# Patient Record
Sex: Female | Born: 2000 | Race: Black or African American | Hispanic: No | Smoking: Never smoker
Health system: Southern US, Community
[De-identification: ages and names within clinical notes are randomized; demographics above are authoritative.]

## PROBLEM LIST (undated history)

## (undated) DIAGNOSIS — Z789 Other specified health status: Secondary | ICD-10-CM

## (undated) DIAGNOSIS — D689 Coagulation defect, unspecified: Secondary | ICD-10-CM

## (undated) DIAGNOSIS — F419 Anxiety disorder, unspecified: Secondary | ICD-10-CM

## (undated) DIAGNOSIS — F32A Depression, unspecified: Secondary | ICD-10-CM

## (undated) HISTORY — PX: NO PAST SURGERIES: SHX2092

## (undated) HISTORY — DX: Anxiety disorder, unspecified: F41.9

## (undated) HISTORY — PX: CHOLECYSTECTOMY: SHX55

## (undated) HISTORY — DX: Depression, unspecified: F32.A

## (undated) HISTORY — DX: Coagulation defect, unspecified: D68.9

---
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
## 2019-10-23 DIAGNOSIS — Z3403 Encounter for supervision of normal first pregnancy, third trimester: Secondary | ICD-10-CM | POA: Insufficient documentation

## 2019-10-24 LAB — OB RESULTS CONSOLE GC/CHLAMYDIA
Chlamydia: NEGATIVE
Gonorrhea: NEGATIVE

## 2019-10-24 LAB — OB RESULTS CONSOLE RPR: RPR: NONREACTIVE

## 2019-10-24 LAB — OB RESULTS CONSOLE VARICELLA ZOSTER ANTIBODY, IGG: Varicella: NON-IMMUNE/NOT IMMUNE

## 2019-10-24 LAB — OB RESULTS CONSOLE HEPATITIS B SURFACE ANTIGEN: Hepatitis B Surface Ag: NEGATIVE

## 2019-10-24 LAB — OB RESULTS CONSOLE RUBELLA ANTIBODY, IGM: Rubella: IMMUNE

## 2020-03-06 ENCOUNTER — Other Ambulatory Visit: Payer: Self-pay

## 2020-03-06 ENCOUNTER — Observation Stay: Payer: PRIVATE HEALTH INSURANCE

## 2020-03-06 ENCOUNTER — Observation Stay
Admission: EM | Admit: 2020-03-06 | Discharge: 2020-03-07 | Disposition: A | Discharge status: Home / Self Care | Payer: PRIVATE HEALTH INSURANCE | Attending: Obstetrics and Gynecology | Admitting: Obstetrics and Gynecology

## 2020-03-06 DIAGNOSIS — O99213 Obesity complicating pregnancy, third trimester: Secondary | ICD-10-CM | POA: Insufficient documentation

## 2020-03-06 DIAGNOSIS — R109 Unspecified abdominal pain: Secondary | ICD-10-CM | POA: Diagnosis present

## 2020-03-06 DIAGNOSIS — O26899 Other specified pregnancy related conditions, unspecified trimester: Secondary | ICD-10-CM

## 2020-03-06 DIAGNOSIS — Z3A31 31 weeks gestation of pregnancy: Secondary | ICD-10-CM | POA: Insufficient documentation

## 2020-03-06 DIAGNOSIS — O4703 False labor before 37 completed weeks of gestation, third trimester: Principal | ICD-10-CM | POA: Insufficient documentation

## 2020-03-06 DIAGNOSIS — O26893 Other specified pregnancy related conditions, third trimester: Secondary | ICD-10-CM | POA: Diagnosis present

## 2020-03-06 LAB — ABO/RH: ABO/RH(D): O POS

## 2020-03-06 LAB — PROTIME-INR
INR: 1 (ref 0.8–1.2)
Prothrombin Time: 12.8 seconds (ref 11.4–15.2)

## 2020-03-06 LAB — URINALYSIS, COMPLETE (UACMP) WITH MICROSCOPIC
Bilirubin Urine: NEGATIVE
Glucose, UA: NEGATIVE mg/dL
Hgb urine dipstick: NEGATIVE
Ketones, ur: 80 mg/dL — AB
Leukocytes,Ua: NEGATIVE
Nitrite: NEGATIVE
Protein, ur: 30 mg/dL — AB
Specific Gravity, Urine: 1.03 (ref 1.005–1.030)
pH: 5 (ref 5.0–8.0)

## 2020-03-06 LAB — FIBRIN DERIVATIVES D-DIMER (ARMC ONLY): Fibrin derivatives D-dimer (ARMC): 1121.72 ng/mL (FEU) — ABNORMAL HIGH (ref 0.00–499.00)

## 2020-03-06 LAB — CBC
HCT: 36.9 % (ref 36.0–46.0)
Hemoglobin: 12.2 g/dL (ref 12.0–15.0)
MCH: 26.2 pg (ref 26.0–34.0)
MCHC: 33.1 g/dL (ref 30.0–36.0)
MCV: 79.2 fL — ABNORMAL LOW (ref 80.0–100.0)
Platelets: 277 10*3/uL (ref 150–400)
RBC: 4.66 MIL/uL (ref 3.87–5.11)
RDW: 13.9 % (ref 11.5–15.5)
WBC: 11.3 10*3/uL — ABNORMAL HIGH (ref 4.0–10.5)
nRBC: 0 % (ref 0.0–0.2)

## 2020-03-06 LAB — CHLAMYDIA/NGC RT PCR (ARMC ONLY)
Chlamydia Tr: NOT DETECTED
N gonorrhoeae: NOT DETECTED

## 2020-03-06 LAB — KLEIHAUER-BETKE STAIN
Fetal Cells %: 0 %
Quantitation Fetal Hemoglobin: 0 mL

## 2020-03-06 LAB — TYPE AND SCREEN
ABO/RH(D): O POS
Antibody Screen: NEGATIVE

## 2020-03-06 LAB — APTT: aPTT: 27 seconds (ref 24–36)

## 2020-03-06 LAB — WET PREP, GENITAL
Clue Cells Wet Prep HPF POC: NONE SEEN
Sperm: NONE SEEN
Trich, Wet Prep: NONE SEEN
Yeast Wet Prep HPF POC: NONE SEEN

## 2020-03-06 LAB — FIBRINOGEN: Fibrinogen: 721 mg/dL — ABNORMAL HIGH (ref 210–475)

## 2020-03-06 LAB — TECHNOLOGIST SMEAR REVIEW

## 2020-03-06 LAB — FETAL FIBRONECTIN: Fetal Fibronectin: NEGATIVE

## 2020-03-06 MED ORDER — CALCIUM CARBONATE ANTACID 500 MG PO CHEW
2.0000 | CHEWABLE_TABLET | ORAL | Status: DC | PRN
Start: 2020-03-06 — End: 2020-03-07

## 2020-03-06 MED ORDER — LACTATED RINGERS IV SOLN: INTRAVENOUS | Status: DC

## 2020-03-06 MED ORDER — ZOLPIDEM TARTRATE 5 MG PO TABS
5.0000 mg | ORAL_TABLET | Freq: Every evening | ORAL | Status: DC | PRN
  Administered 2020-03-06: 5 mg via ORAL
  Filled 2020-03-06: qty 1

## 2020-03-06 MED ORDER — ACETAMINOPHEN 325 MG PO TABS: 650.0000 mg | ORAL_TABLET | ORAL | Status: DC | PRN

## 2020-03-06 MED ORDER — LACTATED RINGERS IV BOLUS
500.0000 mL | Freq: Once | INTRAVENOUS | Status: AC
  Administered 2020-03-06: 500 mL via INTRAVENOUS

## 2020-03-06 MED ORDER — DOCUSATE SODIUM 100 MG PO CAPS: 100.0000 mg | ORAL_CAPSULE | Freq: Every day | ORAL | Status: DC

## 2020-03-06 MED ORDER — PRENATAL MULTIVITAMIN CH: 1.0000 | ORAL_TABLET | Freq: Every day | ORAL | Status: DC

## 2020-03-06 NOTE — H&P (Signed)
OB History & Physical   History of Present Illness:  Chief Complaint: fell on her abdomen around 1830  HPI:  Donna Buckley is a 19 y.o. G1P0 female at [redacted]w[redacted]d dated by LMP and c/w [place] at [redacted]wks GA.  She presents to L&D for abdominal pain after a trip and fall this evening. Reports lower abdominal discomfort and pressure, noted pink spotting when she wiped after initial void after the incident. Denies LOF or bright red bleeding. Feeling active FM since arrival in ER.    Pregnancy Issues: 1. Varicella non-immune 2. Obesity, BMI 39 pre-preg 3. Elevated 1hr GTT 136, 3hr GTT wnl.    Maternal Medical History:  History reviewed. No pertinent past medical history.  Past Surgical History:  Procedure Laterality Date  . NO PAST SURGERIES      No Known Allergies  Prior to Admission medications   Medication Sig Start Date End Date Taking? Authorizing Provider  Prenatal Vit-Fe Fumarate-FA (PRENATAL MULTIVITAMIN) TABS tablet Take 1 tablet by mouth daily at 12 noon.   Yes [provider]     Prenatal care site: [facility]  Social History: She  reports that she has never smoked. She has never used smokeless tobacco. She reports previous alcohol use. She reports that she does not use drugs.  Family History: no family hx Gyn cancers, MGM has diabetes.   Review of Systems: A full review of systems was performed and negative except as noted in the HPI.      Physical Exam:  Vital Signs: BP (!) 124/64 (BP Location: Right Arm)   Pulse 97   Temp 99 F (37.2 C) (Oral)   Resp 18   Ht 5\' 2"  (1.575 m)   Wt (!) 101.2 kg   LMP 07/31/2019 (Exact Date)   BMI 40.79 kg/m   General: no acute distress.  HEENT: normocephalic, atraumatic Heart: regular rate & rhythm.  No murmurs/rubs/gallops Lungs: clear to auscultation bilaterally, normal respiratory effort Abdomen: soft, gravid, non-tender at fundus, slight tenderness across lower abdomen, pt reports area with impact at midline  lower abdomen.  Pelvic: SSE: cloudy white DC noted, cervix with scant normal appearing mucus and visually closed. No bleeding, no LOF or pooling.   External: Normal external female genitalia  Cervix: Dilation: Closed / Effacement (%): Thick /      Extremities: non-tender, symmetric, no edema bilaterally.  DTRs: 2+  Neurologic: Alert & oriented x 3.    No results found for this or any previous visit (from the past 24 hour(s)).  Pertinent Results:  Prenatal Labs: Blood type/Rh O pos  Antibody screen neg  Rubella Immune  Varicella  NON-Immune  RPR NR  HBsAg Neg  HIV NR  GC neg  Chlamydia neg  Genetic screening Negative MaterniT21 and AFP, female fetus  1 hour GTT  136  3 hour GTT 86, 121, 101, 92  GBS  n/a   FHT: 135bpm, mod variability, + accels, no decels TOCO: q13min, palp mild, fundus soft and NT.  SVE:  Dilation: Closed / Effacement (%): Thick /      Cephalic by leopolds  No results found.  Assessment:  Donna Buckley is a 19 y.o. G1P0 female at [redacted]w[redacted]d with abdominal pain and contractions after blunt trauma, fall on abdomen.   Plan:  1. Admit to Labor & Delivery; consents reviewed and obtained - D/w Dr [redacted]w[redacted]d regarding POC.  - Labs ordered including type and screen, CBC, KB screen and DIC panel.  - IV bolus and continuous  fluids ordered for hydration.  - continuous EFM and toco x 24hrs.  - wet prep, GC/CT and FFN obtained, last IC was approx 18hrs ago per pt.  - OB Limited US ordered for presentation, placental location and AFI. BPP requested.   2. Fetal Well being  - Fetal Tracing: Cat I - Group B Streptococcus ppx indicated: n/a  3. Routine OB: - Prenatal labs reviewed, as above - Rh O Pos - Clear fluid diet, IVF- LR    Randa Ngo, CNM 03/06/20 8:22 PM

## 2020-03-06 NOTE — OB Triage Note (Signed)
Pt [redacted]w[redacted]d G1P0 presents [facility] through the ED with c/o abdominal pressure after fall at around 630 pm this evening. States she felt baby move in ED, denied LOF. States she had some pink tinged discharge when wiping before arriving, after arriving states it had cleared up. VSS. Monitors applied and assessing. [id number] at 2.

## 2020-03-07 DIAGNOSIS — O4703 False labor before 37 completed weeks of gestation, third trimester: Secondary | ICD-10-CM | POA: Diagnosis not present

## 2020-03-07 MED ORDER — ACETAMINOPHEN 325 MG PO TABS: 650.0000 mg | ORAL_TABLET | ORAL | Status: DC | PRN

## 2020-03-07 MED ORDER — CALCIUM CARBONATE ANTACID 500 MG PO CHEW: 2.0000 | CHEWABLE_TABLET | ORAL | Status: DC | PRN

## 2020-03-07 MED ORDER — SODIUM CHLORIDE 0.9 % IV SOLN: 250.0000 mL | INTRAVENOUS | Status: DC | PRN

## 2020-03-07 MED ORDER — SODIUM CHLORIDE 0.9% FLUSH: 3.0000 mL | Freq: Two times a day (BID) | INTRAVENOUS | Status: DC

## 2020-03-07 MED ORDER — SODIUM CHLORIDE 0.9% FLUSH
3.0000 mL | INTRAVENOUS | Status: DC | PRN
  Administered 2020-03-07: 3 mL via INTRAVENOUS

## 2020-03-07 NOTE — Discharge Summary (Signed)
Patient ID: Donna Buckley MRN: [id number] DOB/AGE: 21-Apr-2001 19 y.o.  Admit date: 03/06/2020 Discharge date: 03/07/2020  Admission Diagnoses: s/p fall, abdominal pain in pregnancy, 31wks with preterm contractions  Discharge Diagnoses: s/p fall, abdominal pain in pregnancy, 31wks with preterm contractions  Prenatal Procedures: NST and ultrasound  Consults: Neonatology, Maternal Fetal Medicine  Significant Diagnostic Studies:  Results for orders placed or performed during the hospital encounter of 03/06/20 (from the past 168 hour(s))  Chlamydia/NGC rt PCR ([facility] only)   Collection Time: 03/06/20  7:54 PM  Result Value Ref Range   Specimen source GC/Chlam ENDOCERVICAL    Chlamydia Tr NOT DETECTED NOT DETECTED   N gonorrhoeae NOT DETECTED NOT DETECTED  Wet prep, genital   Collection Time: 03/06/20  7:54 PM  Result Value Ref Range   Yeast Wet Prep HPF POC NONE SEEN NONE SEEN   Trich, Wet Prep NONE SEEN NONE SEEN   Clue Cells Wet Prep HPF POC NONE SEEN NONE SEEN   WBC, Wet Prep HPF POC FEW (A) NONE SEEN   Sperm NONE SEEN   Urinalysis, Complete w Microscopic   Collection Time: 03/06/20  7:54 PM  Result Value Ref Range   Color, Urine AMBER (A) YELLOW   APPearance HAZY (A) CLEAR   Specific Gravity, Urine 1.030 1.005 - 1.030   pH 5.0 5.0 - 8.0   Glucose, UA NEGATIVE NEGATIVE mg/dL   Hgb urine dipstick NEGATIVE NEGATIVE   Bilirubin Urine NEGATIVE NEGATIVE   Ketones, ur 80 (A) NEGATIVE mg/dL   Protein, ur 30 (A) NEGATIVE mg/dL   Nitrite NEGATIVE NEGATIVE   Leukocytes,Ua NEGATIVE NEGATIVE   RBC / HPF 0-5 0 - 5 RBC/hpf   WBC, UA 0-5 0 - 5 WBC/hpf   Bacteria, UA RARE (A) NONE SEEN   Squamous Epithelial / LPF 6-10 0 - 5   Mucus PRESENT   CBC on admission   Collection Time: 03/06/20  7:58 PM  Result Value Ref Range   WBC 11.3 (H) 4.0 - 10.5 K/uL   RBC 4.66 3.87 - 5.11 MIL/uL   Hemoglobin 12.2 12.0 - 15.0 g/dL   HCT 50.0 36 - 46 %   MCV 79.2 (L) 80.0 - 100.0 fL   MCH 26.2 26.0 -  34.0 pg   MCHC 33.1 30.0 - 36.0 g/dL   RDW 93.8 18.2 - 99.3 %   Platelets 277 150 - 400 K/uL   nRBC 0.0 0.0 - 0.2 %  Kleihauer-Betke stain   Collection Time: 03/06/20  7:58 PM  Result Value Ref Range   Fetal Cells % 0 %   Quantitation Fetal Hemoglobin 0.0000 mL   # Vials RhIg NOT INDICATED   Fibrin derivatives D-Dimer ([facility] only)   Collection Time: 03/06/20  7:58 PM  Result Value Ref Range   Fibrin derivatives D-dimer ([facility]) 1,121.72 (H) 0.00 - 499.00 ng/mL (FEU)  Fibrinogen   Collection Time: 03/06/20  7:58 PM  Result Value Ref Range   Fibrinogen 721 (H) 210 - 475 mg/dL  Protime-INR   Collection Time: 03/06/20  7:58 PM  Result Value Ref Range   Prothrombin Time 12.8 11.4 - 15.2 seconds   INR 1.0 0.8 - 1.2  APTT   Collection Time: 03/06/20  7:58 PM  Result Value Ref Range   aPTT 27 24 - 36 seconds  Technologist smear review   Collection Time: 03/06/20  7:58 PM  Result Value Ref Range   WBC Morphology MORPHOLOGY UNREMARKABLE    RBC Morphology MORPHOLOGY UNREMARKABLE  Tech Review MORPHOLOGY UNREMARKABLE   Type and screen [facility]   Collection Time: 03/06/20  7:58 PM  Result Value Ref Range   ABO/RH(D) O POS    Antibody Screen NEG    Sample Expiration      03/09/2020,2359 Performed at [facility], [address] [place] [place], [place] [address]   Fetal fibronectin   Collection Time: 03/06/20  8:11 PM  Result Value Ref Range   Fetal Fibronectin NEGATIVE NEGATIVE  ABO/Rh   Collection Time: 03/06/20  9:20 PM  Result Value Ref Range   ABO/RH(D)      O POS Performed at [facility], [address] [place], [place] [address]     Treatments: IV hydration and US performed, continuous EFM x 24hrs s/p fall.   Hospital Course:  This is a 19 y.o. G1P0 with IUP at [redacted]w[redacted]d admitted for s/p blunt abdominal trauma to lower abdomen. Noted to have regular q33min UCs on arrival. Had noted slight amount of pink discharge when she wiped x 1,  but no further after arrival. Noted active FM.  No leaking of fluid and no bright red bleeding.  She was given IV bolus and hydration; DIC panel, type and screen and KB screen all WNL. FFN, wet prep and GC/CT all negative.  She was observed, fetal heart rate monitoring remained reassuring, and she had no signs/symptoms of  preterm labor or abruption. She was deemed stable for discharge to home with outpatient follow up.  Discharge Physical Exam:  BP (!) 109/48   Pulse 83   Temp 97.8 F (36.6 C) (Oral)   Resp 16   Ht 5\' 2"  (1.575 m)   Wt (!) 101.2 kg   LMP 07/31/2019 (Exact Date)   BMI 40.79 kg/m   General: NAD CV: RRR Pulm: CTABL, nl effort ABD: s/nd/nt, gravid DVT Evaluation: LE non-ttp, no evidence of DVT on exam.  SVE: Dilation: Closed Effacement (%): Thick Exam by:: Prisilla Kocsis CNM  FHT: 135bpm, mod variability, + accels, no decels TOCO: UI occasionally    Discharge Condition: Stable  Disposition: Discharge disposition: 01-Home or Self Care        Allergies as of 03/07/2020   No Known Allergies     Medication List    TAKE these medications   acetaminophen 325 MG tablet Commonly known as: TYLENOL Take 2 tablets (650 mg total) by mouth every 4 (four) hours as needed (for pain scale < 4  OR  temperature  >/=  100.5 F).   calcium carbonate 500 MG chewable tablet Commonly known as: TUMS - dosed in mg elemental calcium Chew 2 tablets (400 mg of elemental calcium total) by mouth every 4 (four) hours as needed for indigestion.   prenatal multivitamin Tabs tablet Take 1 tablet by mouth daily at 12 noon.       Follow-up Information    [facility] Follow up.   Why: Keep scheduled appointment this coming week. Contact information: [address] [place] [address]              Signed:  [phone], CNM 03/07/2020  6:35 PM

## 2020-04-08 LAB — OB RESULTS CONSOLE HIV ANTIBODY (ROUTINE TESTING): HIV: NONREACTIVE

## 2020-04-08 LAB — OB RESULTS CONSOLE GBS: GBS: POSITIVE

## 2020-04-12 ENCOUNTER — Observation Stay
Admission: EM | Admit: 2020-04-12 | Discharge: 2020-04-12 | Disposition: A | Discharge status: Home / Self Care | Payer: PRIVATE HEALTH INSURANCE | Attending: Certified Nurse Midwife | Admitting: Certified Nurse Midwife

## 2020-04-12 ENCOUNTER — Observation Stay: Payer: PRIVATE HEALTH INSURANCE

## 2020-04-12 ENCOUNTER — Encounter: Payer: Self-pay | Admitting: Obstetrics and Gynecology

## 2020-04-12 ENCOUNTER — Other Ambulatory Visit: Payer: Self-pay

## 2020-04-12 DIAGNOSIS — O99891 Other specified diseases and conditions complicating pregnancy: Secondary | ICD-10-CM | POA: Diagnosis not present

## 2020-04-12 DIAGNOSIS — Z3A36 36 weeks gestation of pregnancy: Secondary | ICD-10-CM | POA: Insufficient documentation

## 2020-04-12 DIAGNOSIS — M549 Dorsalgia, unspecified: Secondary | ICD-10-CM | POA: Diagnosis not present

## 2020-04-12 HISTORY — DX: Other specified health status: Z78.9

## 2020-04-12 LAB — URINALYSIS, COMPLETE (UACMP) WITH MICROSCOPIC
Bacteria, UA: NONE SEEN
Bilirubin Urine: NEGATIVE
Glucose, UA: NEGATIVE mg/dL
Hgb urine dipstick: NEGATIVE
Ketones, ur: NEGATIVE mg/dL
Nitrite: NEGATIVE
Protein, ur: NEGATIVE mg/dL
Specific Gravity, Urine: 1.012 (ref 1.005–1.030)
pH: 8 (ref 5.0–8.0)

## 2020-04-12 LAB — COMPREHENSIVE METABOLIC PANEL
ALT: 27 U/L (ref 0–44)
AST: 49 U/L — ABNORMAL HIGH (ref 15–41)
Albumin: 2.6 g/dL — ABNORMAL LOW (ref 3.5–5.0)
Alkaline Phosphatase: 132 U/L — ABNORMAL HIGH (ref 38–126)
Anion gap: 12 (ref 5–15)
BUN: <5 mg/dL — ABNORMAL LOW (ref 6–20)
CO2: 20 mmol/L — ABNORMAL LOW (ref 22–32)
Calcium: 8.7 mg/dL — ABNORMAL LOW (ref 8.9–10.3)
Chloride: 106 mmol/L (ref 98–111)
Creatinine, Ser: 0.5 mg/dL (ref 0.44–1.00)
GFR calc Af Amer: >60 mL/min (ref >60)
GFR calc non Af Amer: >60 mL/min (ref >60)
Glucose, Bld: 75 mg/dL (ref 70–99)
Potassium: 3.8 mmol/L (ref 3.5–5.1)
Sodium: 138 mmol/L (ref 135–145)
Total Bilirubin: 0.9 mg/dL (ref 0.3–1.2)
Total Protein: 6.8 g/dL (ref 6.5–8.1)

## 2020-04-12 LAB — CBC
HCT: 31.9 % — ABNORMAL LOW (ref 36.0–46.0)
Hemoglobin: 10.6 g/dL — ABNORMAL LOW (ref 12.0–15.0)
MCH: 26.2 pg (ref 26.0–34.0)
MCHC: 33.2 g/dL (ref 30.0–36.0)
MCV: 78.8 fL — ABNORMAL LOW (ref 80.0–100.0)
Platelets: 253 10*3/uL (ref 150–400)
RBC: 4.05 MIL/uL (ref 3.87–5.11)
RDW: 14.5 % (ref 11.5–15.5)
WBC: 10 10*3/uL (ref 4.0–10.5)
nRBC: 0 % (ref 0.0–0.2)

## 2020-04-12 LAB — PROTEIN / CREATININE RATIO, URINE
Creatinine, Urine: 94 mg/dL
Protein Creatinine Ratio: 0.16 mg/mg{Cre} — ABNORMAL HIGH (ref 0.00–0.15)
Total Protein, Urine: 15 mg/dL

## 2020-04-12 MED ORDER — ACETAMINOPHEN 325 MG PO TABS
650.0000 mg | ORAL_TABLET | ORAL | Status: DC | PRN
  Administered 2020-04-12: 650 mg via ORAL
  Filled 2020-04-12: qty 2

## 2020-04-12 MED ORDER — CYCLOBENZAPRINE HCL 10 MG PO TABS: 10.0000 mg | ORAL_TABLET | Freq: Three times a day (TID) | ORAL | 0 refills | Status: DC | PRN

## 2020-04-12 MED ORDER — CYCLOBENZAPRINE HCL 10 MG PO TABS
10.0000 mg | ORAL_TABLET | Freq: Three times a day (TID) | ORAL | Status: DC | PRN
  Administered 2020-04-12: 10 mg via ORAL
  Filled 2020-04-12 (×2): qty 1

## 2020-04-12 NOTE — Discharge Summary (Signed)
Donna Buckley is a 19 y.o. female. She is at [redacted]w[redacted]d gestation. Patient's last menstrual period was 07/31/2019 (exact date). Estimated Date of Delivery: 05/06/20  Prenatal care site: [facility]   Chief complaint: back pain Location: back pain, left side more than right Onset/timing: today around 0630 Duration: constant then intermittent Severity: mild to severe Aggravating or alleviating conditions: none Associated signs/symptoms: abdominal pain/tightness Context: Donna Buckley reports back pain that started this morning. It was initially constant, but now seems to be coming and going. Nothing in particular seems to make it any better or worse. She has not taken any medication to see if it helps. She reports daily BMs without constipation or diarrhea. She reports no urinary symptoms.   S: Resting comfortably.   She reports:  -active fetal movement -no leakage of fluid -no vaginal bleeding -occasional contractions  Maternal Medical History:   Past Medical History:  Diagnosis Date  . Medical history non-contributory     Past Surgical History:  Procedure Laterality Date  . NO PAST SURGERIES      No Known Allergies  Prior to Admission medications   Medication Sig Start Date End Date Taking? Authorizing Provider  acetaminophen (TYLENOL) 325 MG tablet Take 2 tablets (650 mg total) by mouth every 4 (four) hours as needed (for pain scale < 4  OR  temperature  >/=  100.5 F). 03/07/20  Yes McVey, Prudencio Pair, CNM  Prenatal Vit-Fe Fumarate-FA (PRENATAL MULTIVITAMIN) TABS tablet Take 1 tablet by mouth daily at 12 noon.   Yes [provider]  calcium carbonate (TUMS - DOSED IN MG ELEMENTAL CALCIUM) 500 MG chewable tablet Chew 2 tablets (400 mg of elemental calcium total) by mouth every 4 (four) hours as needed for indigestion. Patient not taking: Reported on 04/12/2020 03/07/20   McVey, Prudencio Pair, CNM     Social History: She  reports that she has never smoked. She has never  used smokeless tobacco. She reports previous alcohol use. She reports that she does not use drugs.  Family History: family history is not on file.  Review of Systems: A full review of systems was performed and negative except as noted in the HPI.     O:  BP (!) 141/80 (BP Location: Left Arm)   Pulse (!) 115   Temp 97.7 F (36.5 C) (Oral)   Resp 20   Ht 5\' 2"  (1.575 m)   Wt 104.3 kg   LMP 07/31/2019 (Exact Date)   BMI 42.07 kg/m  Results for orders placed or performed during the hospital encounter of 04/12/20 (from the past 48 hour(s))  Urinalysis, Complete w Microscopic Urine, Clean Catch   Collection Time: 04/12/20  8:51 AM  Result Value Ref Range   Color, Urine YELLOW (A) YELLOW   APPearance CLOUDY (A) CLEAR   Specific Gravity, Urine 1.012 1.005 - 1.030   pH 8.0 5.0 - 8.0   Glucose, UA NEGATIVE NEGATIVE mg/dL   Hgb urine dipstick NEGATIVE NEGATIVE   Bilirubin Urine NEGATIVE NEGATIVE   Ketones, ur NEGATIVE NEGATIVE mg/dL   Protein, ur NEGATIVE NEGATIVE mg/dL   Nitrite NEGATIVE NEGATIVE   Leukocytes,Ua SMALL (A) NEGATIVE   WBC, UA 0-5 0 - 5 WBC/hpf   Bacteria, UA NONE SEEN NONE SEEN   Squamous Epithelial / LPF 21-50 0 - 5   Mucus PRESENT    Amorphous Crystal PRESENT   Protein / creatinine ratio, urine   Collection Time: 04/12/20  8:51 AM  Result Value Ref Range  Creatinine, Urine 94 mg/dL   Total Protein, Urine 15 mg/dL   Protein Creatinine Ratio 0.16 (H) 0.00 - 0.15 mg/mg[Cre]  Comprehensive metabolic panel   Collection Time: 04/12/20 10:24 AM  Result Value Ref Range   Sodium 138 135 - 145 mmol/L   Potassium 3.8 3.5 - 5.1 mmol/L   Chloride 106 98 - 111 mmol/L   CO2 20 (L) 22 - 32 mmol/L   Glucose, Bld 75 70 - 99 mg/dL   BUN <5 (L) 6 - 20 mg/dL   Creatinine, Ser 2.42 0.44 - 1.00 mg/dL   Calcium 8.7 (L) 8.9 - 10.3 mg/dL   Total Protein 6.8 6.5 - 8.1 g/dL   Albumin 2.6 (L) 3.5 - 5.0 g/dL   AST 49 (H) 15 - 41 U/L   ALT 27 0 - 44 U/L   Alkaline Phosphatase 132  (H) 38 - 126 U/L   Total Bilirubin 0.9 0.3 - 1.2 mg/dL   GFR calc non Af Amer >60 >60 mL/min   GFR calc Af Amer >60 >60 mL/min   Anion gap 12 5 - 15  CBC on admission   Collection Time: 04/12/20 10:24 AM  Result Value Ref Range   WBC 10.0 4.0 - 10.5 K/uL   RBC 4.05 3.87 - 5.11 MIL/uL   Hemoglobin 10.6 (L) 12.0 - 15.0 g/dL   HCT 68.3 (L) 36 - 46 %   MCV 78.8 (L) 80.0 - 100.0 fL   MCH 26.2 26.0 - 34.0 pg   MCHC 33.2 30.0 - 36.0 g/dL   RDW 41.9 62.2 - 29.7 %   Platelets 253 150 - 400 K/uL   nRBC 0.0 0.0 - 0.2 %     Constitutional: NAD, AAOx3  HE/ENT: extraocular movements grossly intact, moist mucous membranes CV: RRR PULM: normal respiratory effort, CTABL     Abd: gravid, mild generalized tenderness, non-distended, soft      Ext: Non-tender, Nonedmeatous   Psych: mood appropriate, speech normal Pelvic: cervix extremely posterior per RN exam  NST/monitoring:  Baseline: 130bpm Variability: moderate Accelerations: 15x15 present x >2 Decelerations: absent Time: 3 hours Toco: initially q3-4 minutes, then occasional   A/P: 19 y.o. [redacted]w[redacted]d here for antenatal surveillance during pregnancy.  Principle diagnosis: musculoskeletal discomforts of pregnancy  Labor  Not present  Fetal Wellbeing  Reactive NST, reassuring for GA  Back pain  UA without evidence of UTI  Pain improved with hydration and PO analgesics  Rx for Flexeril q20h PRN sent to her pharmacy  D/c home stable, precautions reviewed, follow-up as scheduled.    Genia Del 04/12/2020 1:28 PM  ----- Genia Del, CNM Certified Nurse Midwife [facility], Department of OB/GYN [facility]

## 2020-04-12 NOTE — OB Triage Note (Signed)
Pt co back pain mostly on left side 8 out of 10. Denies any urinary symptoms. Pain started around 0630 this am. Pt also co abdominal pain 6 out of 10. Denies any vaginal bleeding, LOF. Reports "lot" of fetal movement. Elaina Hoops

## 2020-04-12 NOTE — Discharge Instructions (Signed)

## 2020-04-15 ENCOUNTER — Other Ambulatory Visit: Payer: Self-pay

## 2020-04-15 NOTE — Progress Notes (Signed)
G1P0 with at [redacted]w[redacted]d, LMP of 07/30/2020, c/w early [place] at [redacted]w[redacted]d.  Scheduled for induction of labor for cholestasis of pregnancy on 04/19/2020.   Prenatal provider: [facility] Pregnancy complicated by: 1. Cholestasis of pregnancy 2. Prepregnancy BMI 39.32 3. Elevated 1hr GTT -> 3hr WNL 4. GBS positive  Prenatal Labs: Blood type/Rh O pos  Antibody screen neg  Rubella Immune  Varicella Non-immune  RPR NR  HBsAg Neg  HIV NR  GC neg  Chlamydia neg  Genetic screening negative  1 hour GTT 136  3 hour GTT 86, 121, 101, 92  GBS Positive    Tdap: given 02/12/2020 Flu: due in season  Contraception: likely POP then transition to COC's Feeding preference: Breast   ____ Donna Buckley, CNM Certified Nurse Midwife [facility]

## 2020-04-16 ENCOUNTER — Other Ambulatory Visit
Admission: RE | Admit: 2020-04-16 | Discharge: 2020-04-16 | Disposition: A | Discharge status: Home / Self Care | Payer: PRIVATE HEALTH INSURANCE | Source: Ambulatory Visit

## 2020-04-16 ENCOUNTER — Other Ambulatory Visit: Payer: Self-pay

## 2020-04-16 DIAGNOSIS — Z20822 Contact with and (suspected) exposure to covid-19: Secondary | ICD-10-CM | POA: Insufficient documentation

## 2020-04-16 DIAGNOSIS — Z01812 Encounter for preprocedural laboratory examination: Secondary | ICD-10-CM | POA: Insufficient documentation

## 2020-04-16 LAB — SARS CORONAVIRUS 2 BY RT PCR (HOSPITAL ORDER, PERFORMED IN ~~LOC~~ HOSPITAL LAB): SARS Coronavirus 2: NEGATIVE

## 2020-04-19 ENCOUNTER — Encounter: Payer: Self-pay | Admitting: Obstetrics and Gynecology

## 2020-04-19 ENCOUNTER — Inpatient Hospital Stay: Payer: PRIVATE HEALTH INSURANCE | Admitting: Anesthesiology

## 2020-04-19 ENCOUNTER — Other Ambulatory Visit: Payer: Self-pay

## 2020-04-19 ENCOUNTER — Inpatient Hospital Stay
Admission: EM | Admit: 2020-04-19 | Discharge: 2020-04-22 | DRG: 786 | Disposition: A | Discharge status: Home / Self Care | Payer: PRIVATE HEALTH INSURANCE | Attending: Obstetrics and Gynecology | Admitting: Obstetrics and Gynecology

## 2020-04-19 ENCOUNTER — Encounter
Admission: EM | Disposition: A | Discharge status: Home / Self Care | Payer: Self-pay | Source: Home / Self Care | Attending: Obstetrics and Gynecology

## 2020-04-19 DIAGNOSIS — D62 Acute posthemorrhagic anemia: Secondary | ICD-10-CM | POA: Diagnosis not present

## 2020-04-19 DIAGNOSIS — O9081 Anemia of the puerperium: Secondary | ICD-10-CM | POA: Diagnosis not present

## 2020-04-19 DIAGNOSIS — O99824 Streptococcus B carrier state complicating childbirth: Secondary | ICD-10-CM | POA: Diagnosis present

## 2020-04-19 DIAGNOSIS — O2662 Liver and biliary tract disorders in childbirth: Principal | ICD-10-CM | POA: Diagnosis present

## 2020-04-19 DIAGNOSIS — O99214 Obesity complicating childbirth: Secondary | ICD-10-CM | POA: Diagnosis present

## 2020-04-19 DIAGNOSIS — Z3A37 37 weeks gestation of pregnancy: Secondary | ICD-10-CM | POA: Diagnosis not present

## 2020-04-19 DIAGNOSIS — K831 Obstruction of bile duct: Secondary | ICD-10-CM | POA: Diagnosis present

## 2020-04-19 DIAGNOSIS — Z20822 Contact with and (suspected) exposure to covid-19: Secondary | ICD-10-CM | POA: Diagnosis present

## 2020-04-19 DIAGNOSIS — Z98891 History of uterine scar from previous surgery: Secondary | ICD-10-CM

## 2020-04-19 LAB — CBC
HCT: 32.6 % — ABNORMAL LOW (ref 36.0–46.0)
Hemoglobin: 11.3 g/dL — ABNORMAL LOW (ref 12.0–15.0)
MCH: 26.6 pg (ref 26.0–34.0)
MCHC: 34.7 g/dL (ref 30.0–36.0)
MCV: 76.7 fL — ABNORMAL LOW (ref 80.0–100.0)
Platelets: 293 10*3/uL (ref 150–400)
RBC: 4.25 MIL/uL (ref 3.87–5.11)
RDW: 14.8 % (ref 11.5–15.5)
WBC: 9.6 10*3/uL (ref 4.0–10.5)
nRBC: 0 % (ref 0.0–0.2)

## 2020-04-19 LAB — TYPE AND SCREEN
ABO/RH(D): O POS
Antibody Screen: NEGATIVE

## 2020-04-19 LAB — COMPREHENSIVE METABOLIC PANEL
ALT: 32 U/L (ref 0–44)
AST: 32 U/L (ref 15–41)
Albumin: 2.7 g/dL — ABNORMAL LOW (ref 3.5–5.0)
Alkaline Phosphatase: 143 U/L — ABNORMAL HIGH (ref 38–126)
Anion gap: 9 (ref 5–15)
BUN: 7 mg/dL (ref 6–20)
CO2: 21 mmol/L — ABNORMAL LOW (ref 22–32)
Calcium: 8.9 mg/dL (ref 8.9–10.3)
Chloride: 105 mmol/L (ref 98–111)
Creatinine, Ser: 0.48 mg/dL (ref 0.44–1.00)
GFR calc Af Amer: >60 mL/min (ref >60)
GFR calc non Af Amer: >60 mL/min (ref >60)
Glucose, Bld: 110 mg/dL — ABNORMAL HIGH (ref 70–99)
Potassium: 3.6 mmol/L (ref 3.5–5.1)
Sodium: 135 mmol/L (ref 135–145)
Total Bilirubin: 0.6 mg/dL (ref 0.3–1.2)
Total Protein: 7.1 g/dL (ref 6.5–8.1)

## 2020-04-19 LAB — RPR: RPR Ser Ql: NONREACTIVE

## 2020-04-19 SURGERY — Surgical Case
Anesthesia: Epidural

## 2020-04-19 MED ORDER — TERBUTALINE SULFATE 1 MG/ML IJ SOLN
0.2500 mg | Freq: Once | INTRAMUSCULAR | Status: AC | PRN
  Administered 2020-04-19: 0.25 mg via SUBCUTANEOUS
  Filled 2020-04-19: qty 1

## 2020-04-19 MED ORDER — ONDANSETRON HCL 4 MG/2ML IJ SOLN: 4.0000 mg | Freq: Four times a day (QID) | INTRAMUSCULAR | Status: DC | PRN

## 2020-04-19 MED ORDER — ACETAMINOPHEN 500 MG PO TABS: 1000.0000 mg | ORAL_TABLET | Freq: Four times a day (QID) | ORAL | Status: DC | PRN

## 2020-04-19 MED ORDER — PHENYLEPHRINE 40 MCG/ML (10ML) SYRINGE FOR IV PUSH (FOR BLOOD PRESSURE SUPPORT): 80.0000 ug | PREFILLED_SYRINGE | INTRAVENOUS | Status: DC | PRN

## 2020-04-19 MED ORDER — CEFAZOLIN SODIUM-DEXTROSE 2-4 GM/100ML-% IV SOLN
2.0000 g | INTRAVENOUS | Status: AC
  Administered 2020-04-19: 2 g via INTRAVENOUS
  Filled 2020-04-19: qty 100

## 2020-04-19 MED ORDER — OXYTOCIN-SODIUM CHLORIDE 30-0.9 UT/500ML-% IV SOLN
2.5000 [IU]/h | INTRAVENOUS | Status: DC
  Administered 2020-04-19: 600 m[IU]/h via INTRAVENOUS
  Filled 2020-04-19: qty 1000
  Filled 2020-04-19: qty 500

## 2020-04-19 MED ORDER — OXYTOCIN 10 UNIT/ML IJ SOLN
INTRAMUSCULAR | Status: AC
  Filled 2020-04-19: qty 2

## 2020-04-19 MED ORDER — LACTATED RINGERS IV SOLN
INTRAVENOUS | Status: DC
  Administered 2020-04-19: 1000 mL via INTRAVENOUS

## 2020-04-19 MED ORDER — AMMONIA AROMATIC IN INHA
RESPIRATORY_TRACT | Status: AC
  Filled 2020-04-19: qty 10

## 2020-04-19 MED ORDER — KETAMINE HCL 50 MG/ML IJ SOLN
INTRAMUSCULAR | Status: AC
  Filled 2020-04-19: qty 10

## 2020-04-19 MED ORDER — LIDOCAINE HCL (PF) 1 % IJ SOLN
INTRAMUSCULAR | Status: AC
  Filled 2020-04-19: qty 30

## 2020-04-19 MED ORDER — BUTORPHANOL TARTRATE 1 MG/ML IJ SOLN
1.0000 mg | INTRAMUSCULAR | Status: DC | PRN
  Administered 2020-04-19: 1 mg via INTRAVENOUS
  Filled 2020-04-19: qty 1

## 2020-04-19 MED ORDER — MISOPROSTOL 25 MCG QUARTER TABLET
25.0000 ug | ORAL_TABLET | ORAL | Status: DC | PRN
  Administered 2020-04-19: 25 ug via BUCCAL
  Filled 2020-04-19: qty 1

## 2020-04-19 MED ORDER — LACTATED RINGERS IV SOLN
500.0000 mL | Freq: Once | INTRAVENOUS | Status: AC
  Administered 2020-04-19: 500 mL via INTRAVENOUS

## 2020-04-19 MED ORDER — SODIUM CHLORIDE 0.9 % IV SOLN
5.0000 10*6.[IU] | Freq: Once | INTRAVENOUS | Status: AC
  Administered 2020-04-19: 5 10*6.[IU] via INTRAVENOUS
  Filled 2020-04-19: qty 5

## 2020-04-19 MED ORDER — FENTANYL CITRATE (PF) 100 MCG/2ML IJ SOLN
INTRAMUSCULAR | Status: AC
  Filled 2020-04-19: qty 2

## 2020-04-19 MED ORDER — FENTANYL CITRATE (PF) 100 MCG/2ML IJ SOLN
25.0000 ug | INTRAMUSCULAR | Status: DC | PRN
  Administered 2020-04-20 (×4): 25 ug via INTRAVENOUS
  Filled 2020-04-19: qty 2

## 2020-04-19 MED ORDER — DIPHENHYDRAMINE HCL 50 MG/ML IJ SOLN: 12.5000 mg | INTRAMUSCULAR | Status: DC | PRN

## 2020-04-19 MED ORDER — KETOROLAC TROMETHAMINE 30 MG/ML IJ SOLN
15.0000 mg | Freq: Four times a day (QID) | INTRAMUSCULAR | Status: DC | PRN
  Administered 2020-04-20: 15 mg via INTRAVENOUS
  Filled 2020-04-19: qty 1

## 2020-04-19 MED ORDER — BUPIVACAINE 0.25 % ON-Q PUMP DUAL CATH 400 ML
400.0000 mL | INJECTION | Status: DC
  Filled 2020-04-19: qty 400

## 2020-04-19 MED ORDER — LIDOCAINE HCL (CARDIAC) PF 100 MG/5ML IV SOSY
PREFILLED_SYRINGE | INTRAVENOUS | Status: DC | PRN
  Administered 2020-04-19 (×4): 5 mL via INTRAVENOUS

## 2020-04-19 MED ORDER — MISOPROSTOL 200 MCG PO TABS
ORAL_TABLET | ORAL | Status: AC
  Filled 2020-04-19: qty 4

## 2020-04-19 MED ORDER — BUPIVACAINE HCL (PF) 0.25 % IJ SOLN
INTRAMUSCULAR | Status: DC | PRN
  Administered 2020-04-19: 6 mL via EPIDURAL

## 2020-04-19 MED ORDER — PHENYLEPHRINE 40 MCG/ML (10ML) SYRINGE FOR IV PUSH (FOR BLOOD PRESSURE SUPPORT)
80.0000 ug | PREFILLED_SYRINGE | INTRAVENOUS | Status: DC | PRN
  Administered 2020-04-19: 100 ug via INTRAVENOUS

## 2020-04-19 MED ORDER — FENTANYL CITRATE (PF) 100 MCG/2ML IJ SOLN
INTRAMUSCULAR | Status: DC | PRN
Start: 2020-04-19 — End: 2020-04-19
  Administered 2020-04-19 (×2): 50 ug via EPIDURAL

## 2020-04-19 MED ORDER — OXYTOCIN-SODIUM CHLORIDE 30-0.9 UT/500ML-% IV SOLN
1.0000 m[IU]/min | INTRAVENOUS | Status: DC
  Administered 2020-04-19: 2 m[IU]/min via INTRAVENOUS
  Filled 2020-04-19: qty 500

## 2020-04-19 MED ORDER — CALCIUM CARBONATE ANTACID 500 MG PO CHEW: 400.0000 mg | CHEWABLE_TABLET | Freq: Three times a day (TID) | ORAL | Status: DC | PRN

## 2020-04-19 MED ORDER — BUPIVACAINE HCL (PF) 0.5 % IJ SOLN
INTRAMUSCULAR | Status: DC | PRN
  Administered 2020-04-19: 5 mL via INTRATHECAL

## 2020-04-19 MED ORDER — OXYTOCIN BOLUS FROM INFUSION: 333.0000 mL | Freq: Once | INTRAVENOUS | Status: DC

## 2020-04-19 MED ORDER — BUPIVACAINE HCL (PF) 0.5 % IJ SOLN
INTRAMUSCULAR | Status: DC | PRN
  Administered 2020-04-19: 10 mL

## 2020-04-19 MED ORDER — ONDANSETRON HCL 4 MG/2ML IJ SOLN: 4.0000 mg | Freq: Once | INTRAMUSCULAR | Status: DC | PRN

## 2020-04-19 MED ORDER — KETAMINE HCL 50 MG/ML IJ SOLN
INTRAMUSCULAR | Status: DC | PRN
  Administered 2020-04-19: 25 mg via INTRAMUSCULAR

## 2020-04-19 MED ORDER — BUPIVACAINE HCL 0.5 % IJ SOLN
20.0000 mL | INTRAMUSCULAR | Status: DC
  Filled 2020-04-19: qty 20

## 2020-04-19 MED ORDER — ONDANSETRON HCL 4 MG/2ML IJ SOLN
INTRAMUSCULAR | Status: DC | PRN
  Administered 2020-04-19: 4 mg via INTRAVENOUS

## 2020-04-19 MED ORDER — SODIUM CHLORIDE 0.9 % IV SOLN
INTRAVENOUS | Status: DC | PRN
  Administered 2020-04-19: 30 ug/min via INTRAVENOUS

## 2020-04-19 MED ORDER — METHYLERGONOVINE MALEATE 0.2 MG/ML IJ SOLN
INTRAMUSCULAR | Status: AC
  Filled 2020-04-19: qty 1

## 2020-04-19 MED ORDER — FENTANYL 2.5 MCG/ML W/ROPIVACAINE 0.15% IN NS 100 ML EPIDURAL (ARMC)
EPIDURAL | Status: AC
  Filled 2020-04-19: qty 100

## 2020-04-19 MED ORDER — EPHEDRINE 5 MG/ML INJ: 10.0000 mg | INTRAVENOUS | Status: DC | PRN

## 2020-04-19 MED ORDER — ONDANSETRON HCL 4 MG/2ML IJ SOLN
INTRAMUSCULAR | Status: AC
  Filled 2020-04-19: qty 2

## 2020-04-19 MED ORDER — SOD CITRATE-CITRIC ACID 500-334 MG/5ML PO SOLN
30.0000 mL | ORAL | Status: AC
  Administered 2020-04-19: 30 mL via ORAL
  Filled 2020-04-19: qty 30

## 2020-04-19 MED ORDER — LIDOCAINE HCL (PF) 1 % IJ SOLN: 30.0000 mL | INTRAMUSCULAR | Status: DC | PRN

## 2020-04-19 MED ORDER — BUPIVACAINE HCL (PF) 0.5 % IJ SOLN
INTRAMUSCULAR | Status: AC
  Filled 2020-04-19: qty 30

## 2020-04-19 MED ORDER — LIDOCAINE HCL (PF) 1 % IJ SOLN
INTRAMUSCULAR | Status: DC | PRN
  Administered 2020-04-19: 3 mL

## 2020-04-19 MED ORDER — FENTANYL 2.5 MCG/ML W/ROPIVACAINE 0.15% IN NS 100 ML EPIDURAL (ARMC)
12.0000 mL/h | EPIDURAL | Status: DC
  Administered 2020-04-19: 12 mL/h via EPIDURAL
  Filled 2020-04-19: qty 100

## 2020-04-19 MED ORDER — LACTATED RINGERS IV SOLN
500.0000 mL | INTRAVENOUS | Status: DC | PRN
  Administered 2020-04-19 (×2): 500 mL via INTRAVENOUS

## 2020-04-19 MED ORDER — MISOPROSTOL 25 MCG QUARTER TABLET
25.0000 ug | ORAL_TABLET | ORAL | Status: DC | PRN
  Administered 2020-04-19: 25 ug via VAGINAL
  Filled 2020-04-19: qty 1

## 2020-04-19 MED ORDER — PROPOFOL 10 MG/ML IV BOLUS
INTRAVENOUS | Status: AC
  Filled 2020-04-19: qty 20

## 2020-04-19 MED ORDER — IBUPROFEN 800 MG PO TABS
800.0000 mg | ORAL_TABLET | Freq: Three times a day (TID) | ORAL | Status: DC
  Administered 2020-04-20 – 2020-04-22 (×7): 800 mg via ORAL
  Filled 2020-04-19 (×7): qty 1

## 2020-04-19 MED ORDER — METHYLERGONOVINE MALEATE 0.2 MG/ML IJ SOLN
INTRAMUSCULAR | Status: DC | PRN
  Administered 2020-04-19: .2 mg via INTRAMUSCULAR

## 2020-04-19 MED ORDER — PENICILLIN G POT IN DEXTROSE 60000 UNIT/ML IV SOLN
3.0000 10*6.[IU] | INTRAVENOUS | Status: DC
  Administered 2020-04-19: 3 10*6.[IU] via INTRAVENOUS
  Filled 2020-04-19: qty 50

## 2020-04-19 MED ORDER — SODIUM CHLORIDE 0.9 % IV SOLN
500.0000 mg | Freq: Once | INTRAVENOUS | Status: AC
  Administered 2020-04-19: 500 mg via INTRAVENOUS
  Filled 2020-04-19: qty 500

## 2020-04-19 MED ORDER — LIDOCAINE-EPINEPHRINE (PF) 1.5 %-1:200000 IJ SOLN
INTRAMUSCULAR | Status: DC | PRN
  Administered 2020-04-19: 3 mL via PERINEURAL

## 2020-04-19 SURGICAL SUPPLY — 33 items
BAG COUNTER SPONGE EZ (MISCELLANEOUS) ×2 IMPLANT
CANISTER SUCT 3000ML PPV (MISCELLANEOUS) ×3 IMPLANT
CATH KIT ON-Q SILVERSOAK 5IN (CATHETERS) ×6 IMPLANT
CHLORAPREP W/TINT 26 (MISCELLANEOUS) ×6 IMPLANT
CLOSURE WOUND 1/2 X4 (GAUZE/BANDAGES/DRESSINGS) ×1
COUNTER SPONGE BAG EZ (MISCELLANEOUS) ×1
DERMABOND ADHESIVE PROPEN (GAUZE/BANDAGES/DRESSINGS) ×4
DERMABOND ADVANCED (GAUZE/BANDAGES/DRESSINGS) ×2
DERMABOND ADVANCED .7 DNX12 (GAUZE/BANDAGES/DRESSINGS) ×1 IMPLANT
DERMABOND ADVANCED .7 DNX6 (GAUZE/BANDAGES/DRESSINGS) ×2 IMPLANT
DRSG OPSITE POSTOP 4X10 (GAUZE/BANDAGES/DRESSINGS) ×3 IMPLANT
DRSG TELFA 3X8 NADH (GAUZE/BANDAGES/DRESSINGS) ×3 IMPLANT
ELECT CAUTERY BLADE 6.4 (BLADE) ×3 IMPLANT
ELECT REM PT RETURN 9FT ADLT (ELECTROSURGICAL) ×3
ELECTRODE REM PT RTRN 9FT ADLT (ELECTROSURGICAL) ×1 IMPLANT
GAUZE SPONGE 4X4 12PLY STRL (GAUZE/BANDAGES/DRESSINGS) ×3 IMPLANT
GLOVE BIO SURGEON STRL SZ7 (GLOVE) ×3 IMPLANT
GLOVE INDICATOR 7.5 STRL GRN (GLOVE) ×3 IMPLANT
GOWN STRL REUS W/ TWL LRG LVL3 (GOWN DISPOSABLE) ×3 IMPLANT
GOWN STRL REUS W/TWL LRG LVL3 (GOWN DISPOSABLE) ×6
NS IRRIG 1000ML POUR BTL (IV SOLUTION) ×3 IMPLANT
PACK C SECTION (MISCELLANEOUS) ×3 IMPLANT
PAD OB MATERNITY 4.3X12.25 (PERSONAL CARE ITEMS) ×3 IMPLANT
PAD PREP 24X41 OB/GYN DISP (PERSONAL CARE ITEMS) ×3 IMPLANT
PENCIL SMOKE ULTRAEVAC 22 CON (MISCELLANEOUS) ×3 IMPLANT
STRIP CLOSURE SKIN 1/2X4 (GAUZE/BANDAGES/DRESSINGS) ×2 IMPLANT
STRIP CLOSURE SKIN 1X5 (GAUZE/BANDAGES/DRESSINGS) ×10 IMPLANT
STRIP CLOSURE STERI-STRIP 1X5 (GAUZE/BANDAGES/DRESSINGS) ×5
SUT MNCRL AB 4-0 PS2 18 (SUTURE) ×3 IMPLANT
SUT PDS AB 1 TP1 96 (SUTURE) ×6 IMPLANT
SUT VIC AB 0 CTX 36 (SUTURE) ×6
SUT VIC AB 0 CTX36XBRD ANBCTRL (SUTURE) ×3 IMPLANT
SUT VIC AB 2-0 CT1 36 (SUTURE) ×3 IMPLANT

## 2020-04-19 NOTE — H&P (Signed)
OB History & Physical   History of Present Illness:  Chief Complaint: induction of labor for cholestasis  HPI:  Donna Buckley is a 19 y.o. G1P0 female at [redacted]w[redacted]d dated by [place] at [redacted]w[redacted]d.  She presents to L&D for induction of labor for cholestasis dx at 37+0wks.  - s/p cytotec dosing at 0103; with subsequent cat II tracing- periods of late decels, minimal variability intermittently throughout the night. FHR responsive to intrauterine resuscitation methods.  - currently pt is feeling mild cramping pressure with UCs.   Pregnancy Issues: 1. Cholestasis of pregnancy 2. Prepregnancy BMI 39.32 3. Elevated 1hr GTT -> 3hr WNL 4. GBS positive   Maternal Medical History:   Past Medical History:  Diagnosis Date   Medical history non-contributory     Past Surgical History:  Procedure Laterality Date   NO PAST SURGERIES      No Known Allergies  Prior to Admission medications   Medication Sig Start Date End Date Taking? Authorizing Provider  Prenatal Vit-Fe Fumarate-FA (PRENATAL MULTIVITAMIN) TABS tablet Take 1 tablet by mouth daily at 12 noon.   Yes [provider]  cyclobenzaprine (FLEXERIL) 10 MG tablet Take 1 tablet (10 mg total) by mouth every 8 (eight) hours as needed for muscle spasms (back pain). 04/12/20   Genia Del, CNM     Prenatal care site: [facility]  Social History: She  reports that she has never smoked. She has never used smokeless tobacco. She reports previous alcohol use. She reports that she does not use drugs.  Family History: no family Hx Gyn cancers  Review of Systems: A full review of systems was performed and negative except as noted in the HPI.     Physical Exam:  Vital Signs: BP 122/62 (BP Location: Right Arm)    Pulse 80    Temp 98.4 F (36.9 C) (Oral)    Resp 16    Ht 5\' 2"  (1.575 m)    Wt 104.3 kg    LMP 07/31/2019 (Exact Date)    BMI 42.07 kg/m   General: no acute distress.  HEENT: normocephalic, atraumatic Heart: regular  rate & rhythm.  No murmurs/rubs/gallops Lungs: clear to auscultation bilaterally, normal respiratory effort Abdomen: soft, gravid, non-tender;  EFW: 7.5lbs Pelvic:   External: Normal external female genitalia  Cervix: Dilation: 1 / Effacement (%): 50 / Station: -2    Extremities: non-tender, symmetric, no edema bilaterally.  DTRs: 2+  Neurologic: Alert & oriented x 3.    Results for orders placed or performed during the hospital encounter of 04/19/20 (from the past 24 hour(s))  Type and screen     Status: None   Collection Time: 04/19/20 12:18 AM  Result Value Ref Range   ABO/RH(D) O POS    Antibody Screen NEG    Sample Expiration      04/22/2020,2359 Performed at [facility], [address] [place], [place]   CBC     Status: Abnormal   Collection Time: 04/19/20 12:42 AM  Result Value Ref Range   WBC 9.6 4.0 - 10.5 K/uL   RBC 4.25 3.87 - 5.11 MIL/uL   Hemoglobin 11.3 (L) 12.0 - 15.0 g/dL   HCT 06/19/20 (L) 36 - 46 %   MCV 76.7 (L) 80.0 - 100.0 fL   MCH 26.6 26.0 - 34.0 pg   MCHC 34.7 30.0 - 36.0 g/dL   RDW 94.4 96.7 - 59.1 %   Platelets 293 150 - 400 K/uL   nRBC 0.0 0.0 -  0.2 %  Comprehensive metabolic panel     Status: Abnormal   Collection Time: 04/19/20 12:42 AM  Result Value Ref Range   Sodium 135 135 - 145 mmol/L   Potassium 3.6 3.5 - 5.1 mmol/L   Chloride 105 98 - 111 mmol/L   CO2 21 (L) 22 - 32 mmol/L   Glucose, Bld 110 (H) 70 - 99 mg/dL   BUN 7 6 - 20 mg/dL   Creatinine, Ser 4.33 0.44 - 1.00 mg/dL   Calcium 8.9 8.9 - 29.5 mg/dL   Total Protein 7.1 6.5 - 8.1 g/dL   Albumin 2.7 (L) 3.5 - 5.0 g/dL   AST 32 15 - 41 U/L   ALT 32 0 - 44 U/L   Alkaline Phosphatase 143 (H) 38 - 126 U/L   Total Bilirubin 0.6 0.3 - 1.2 mg/dL   GFR calc non Af Amer >60 >60 mL/min   GFR calc Af Amer >60 >60 mL/min   Anion gap 9 5 - 15    Pertinent Results:  Prenatal Labs: Blood type/Rh O pos  Antibody screen neg  Rubella Immune  Varicella  NON-Immune  RPR NR   HBsAg Neg  HIV NR  GC neg  Chlamydia neg  Genetic screening Negative MaterniT21 and AFP, female fetus  1 hour GTT  136  3 hour GTT 86, 121, 101, 92  GBS  POS    FHT: Cat I tracing, 130bpm, moderate variability, + accels no decels TOCO: 2-66min, palp mild-mod.   SVE:  Dilation: 1 / Effacement (%): 50 / Station: -2  - cervix soft/midposition. Fetal vertex well applied.  Donna Buckley Cath placed with 79ml uterine and 53ml in vaginal balloons, Pt tolerated well.    Cephalic by leopolds and SVE   Assessment:  Donna Buckley is a 19 y.o. G1P0 female at [redacted]w[redacted]d with cholestasis of pregnancy.   Plan:  1. Admit to Labor & Delivery; consents reviewed and obtained - COVID negative  2. Fetal Well being  - Fetal Tracing: Cat I tracing - Group B Streptococcus ppx indicated: POs, will start PCN with active labor or ROM.  - Presentation: cephalic confirmed by exam and Leopolds   3. Routine OB: - Prenatal labs reviewed, as above - Rh O Pos - CBC, T&S, RPR on admit - Clear fluids, IVF  4. Induction of Labor -  Contractions: external toco in place -  Pelvis adequate for TOL.  -  Plan for induction with cook cath, will start Pitocin if UC pattern spaces out or when cook cath is out.  -  Plan for continuous fetal monitoring  -  Maternal pain control as desired - Anticipate vaginal delivery  5. Post Partum Planning: - Infant feeding: breast - Contraception: TBD  Randa Ngo, CNM 04/19/20 8:38 AM

## 2020-04-19 NOTE — Progress Notes (Signed)
Labor Progress Note  Donna Buckley is a 19 y.o. G1P0 at [redacted]w[redacted]d by ultrasound admitted for induction of labor due to cholestasis.  Subjective: comfortable after epidural   Objective: BP 116/71   Pulse 85   Temp 98.4 F (36.9 C) (Oral)   Resp 16   Ht 5\' 2"  (1.575 m)   Wt 104.3 kg   LMP 07/31/2019 (Exact Date)   SpO2 99%   BMI 42.07 kg/m  Notable VS details: reviewed  Fetal Assessment: Cat II tracing continues intermittently.   FHT:  FHR: 135 bpm, variability: min-mod,  accelerations:  Present,  decelerations:  Present early, late variable Category/reactivity:  Category II UC:   irregular, every 2-3 minutes; Pitocin at 25mu/min SVE:  7/75/-2, soft/anterior; asynclitic presentation with cervix located more on pt right.  Membrane status: AROM at 1737 Amniotic color: clear  Labs: Lab Results  Component Value Date   WBC 9.6 04/19/2020   HGB 11.3 (L) 04/19/2020   HCT 32.6 (L) 04/19/2020   MCV 76.7 (L) 04/19/2020   PLT 293 04/19/2020    Assessment / Plan: G1 at 37.4wks, IOL for cholestasis  Labor: cytotec, Cook cath and on low dose Pitocin . AROM performed.  Preeclampsia:  no e/o Pree Fetal Wellbeing:  Category II  will monitor closely. Discussed progress and tracing with Dr 06/19/2020.  Pain Control:  Epidural I/D:  GBS Pos Anticipated MOD:  NSVD  Bonney Aid, CNM 04/19/2020, 8:26 PM

## 2020-04-19 NOTE — Progress Notes (Signed)
Subjective:  Comfortable epidural in place  Objective:   Vitals: Blood pressure 116/71, pulse 85, temperature 98.4 F (36.9 C), temperature source Oral, resp. rate 16, height 5\' 2"  (1.575 m), weight 104.3 kg, last menstrual period 07/31/2019, SpO2 99 %. General: NAD Abdomen: gravid, non-tender Cervical Exam:  Dilation: 7 Effacement (%):  (75) Cervical Position: Middle Station: -2 Presentation: Vertex Exam by:: R. McVey, CNM  FHT: 140, minimal to moderate variability, no accels, recurrent late deceleration resolved with cessation of pitocin but did not respond to position changes Toco: q30min  Results for orders placed or performed during the hospital encounter of 04/19/20 (from the past 24 hour(s))  Type and screen     Status: None   Collection Time: 04/19/20 12:18 AM  Result Value Ref Range   ABO/RH(D) O POS    Antibody Screen NEG    Sample Expiration      04/22/2020,2359 Performed at [facility], [address] [place], [place]   CBC     Status: Abnormal   Collection Time: 04/19/20 12:42 AM  Result Value Ref Range   WBC 9.6 4.0 - 10.5 K/uL   RBC 4.25 3.87 - 5.11 MIL/uL   Hemoglobin 11.3 (L) 12.0 - 15.0 g/dL   HCT 06/19/20 (L) 36 - 46 %   MCV 76.7 (L) 80.0 - 100.0 fL   MCH 26.6 26.0 - 34.0 pg   MCHC 34.7 30.0 - 36.0 g/dL   RDW 58.8 50.2 - 77.4 %   Platelets 293 150 - 400 K/uL   nRBC 0.0 0.0 - 0.2 %  Comprehensive metabolic panel     Status: Abnormal   Collection Time: 04/19/20 12:42 AM  Result Value Ref Range   Sodium 135 135 - 145 mmol/L   Potassium 3.6 3.5 - 5.1 mmol/L   Chloride 105 98 - 111 mmol/L   CO2 21 (L) 22 - 32 mmol/L   Glucose, Bld 110 (H) 70 - 99 mg/dL   BUN 7 6 - 20 mg/dL   Creatinine, Ser 06/19/20 0.44 - 1.00 mg/dL   Calcium 8.9 8.9 - 7.86 mg/dL   Total Protein 7.1 6.5 - 8.1 g/dL   Albumin 2.7 (L) 3.5 - 5.0 g/dL   AST 32 15 - 41 U/L   ALT 32 0 - 44 U/L   Alkaline Phosphatase 143 (H) 38 - 126 U/L   Total Bilirubin 0.6 0.3 -  1.2 mg/dL   GFR calc non Af Amer >60 >60 mL/min   GFR calc Af Amer >60 >60 mL/min   Anion gap 9 5 - 15  RPR     Status: None   Collection Time: 04/19/20 12:42 AM  Result Value Ref Range   RPR Ser Ql NON REACTIVE NON REACTIVE    Assessment:   19 y.o. G1P0 [redacted]w[redacted]d IOL cholestasis of pregnancy  Plan:   1) Labor - active phase arrest at 7cm with adequate MVU.  Given combination of active phase arrest as well as category II tracing discussed proceeding with cesarean section.  The patient was counseled regarding risk and benefits to proceeding with Cesarean section to expedite delivery.  Risk of cesarean section were discussed including risk of bleeding and need for potential intraoperative or postoperative blood transfusion with a rate of approximately 5% quoted for all Cesarean sections, risk of injury to adjacent organs including but not limited to bowl and bladder, the need for additional surgical procedures to address such injuries, and the risk of infection.  The risk  of continued attempts at vaginal delivery include but are note limited to worsening fetal or maternal status.  After consideration of options the patient is amenable to proceed with primary cesarean section for delivery.   2) Fetus - cat II tracing, late deceleration which were unable to be resolved with position changes.    Vena Austria, MD, [facility], [facility] 04/19/2020, 9:19 PM

## 2020-04-19 NOTE — Progress Notes (Signed)
Discussed FHR tracing with provider. Expressed concern over decreased variability and recurrent late decelerations. Discussed continuing position changes and intrauterine resuscitation. Requested provider review FHR tracing at this time. Will continue to monitor.

## 2020-04-19 NOTE — Progress Notes (Signed)
Labor Progress Note  Donna Buckley is a 19 y.o. G1P0 at [redacted]w[redacted]d by ultrasound admitted for induction of labor due to cholestasis.  Subjective: comfortable after epidural, has   Objective: BP 116/71   Pulse 85   Temp 98.4 F (36.9 C) (Oral)   Resp 16   Ht 5\' 2"  (1.575 m)   Wt 104.3 kg   LMP 07/31/2019 (Exact Date)   SpO2 99%   BMI 42.07 kg/m  Notable VS details: reviewed  Fetal Assessment: Cat II tracing continues intermittently.   FHT:  FHR: 140 bpm, variability: minimal ,  accelerations:  Abscent,  decelerations:  Present recurrent late decels not responsvie to intrauterine resusc measures.  Category/reactivity:  Category II UC:   every 2-3 minutes; Pitocin at 25mu/min; adequate MVUs SVE:  7/75/-2, soft/anterior; cervix with slight swelling and caput noted.   Membrane status: AROM at 1737 Amniotic color: clear  Labs: Lab Results  Component Value Date   WBC 9.6 04/19/2020   HGB 11.3 (L) 04/19/2020   HCT 32.6 (L) 04/19/2020   MCV 76.7 (L) 04/19/2020   PLT 293 04/19/2020    Assessment / Plan: G1 at 37.4wks, IOL for cholestasis  Labor: cytotec, Cook cath and on low dose Pitocin . AROM performed. 5.5hrs at 7cm and now fetal intolerance of labor, continues to have recurrent late decels and not responsvie to intrauterine resusc measures.  Preeclampsia:  no e/o Pree Fetal Wellbeing:  Category II  will monitor closely. Discussed progress and tracing with Dr 06/19/2020.  Pain Control:  Epidural I/D:  GBS Pos Anticipated MOD:  WIll proceed to primary LTCS for active phase arrest and fetal intolerance.   Bonney Aid, CNM 04/19/2020, 9:12 PM

## 2020-04-19 NOTE — Anesthesia Preprocedure Evaluation (Addendum)
Anesthesia Evaluation  Patient identified by MRN, date of birth, ID band Patient awake    Reviewed: Allergy & Precautions, NPO status , Patient's Chart, lab work & pertinent test results  Airway Mallampati: III       Dental   Pulmonary neg pulmonary ROS,           Cardiovascular negative cardio ROS       Neuro/Psych negative neurological ROS  negative psych ROS   GI/Hepatic negative GI ROS, Neg liver ROS,   Endo/Other  Morbid obesity  Renal/GU negative Renal ROS  negative genitourinary   Musculoskeletal negative musculoskeletal ROS (+)   Abdominal   Peds negative pediatric ROS (+)  Hematology negative hematology ROS (+)   Anesthesia Other Findings   Reproductive/Obstetrics (+) Pregnancy                             Anesthesia Physical Anesthesia Plan  ASA: II  Anesthesia Plan: Epidural   Post-op Pain Management:    Induction:   PONV Risk Score and Plan:   Airway Management Planned: Natural Airway  Additional Equipment:   Intra-op Plan:   Post-operative Plan:   Informed Consent:     Dental advisory given  Plan Discussed with: CRNA and Surgeon  Anesthesia Plan Comments: (Failure to progress with intermittant decelerations.  Will use epidural and dose up for C-S as it has been working well..Called patient. Advised patient of GOT if epidural not adequate after dosing.  Provided patient w/ verbal instructions concerning pre-, intra- and post-procedure preparation and instructions.  Patient verbalized understanding of the above.)       Anesthesia Quick Evaluation

## 2020-04-19 NOTE — Progress Notes (Signed)
Labor Progress Note  Donna Buckley is a 19 y.o. G1P0 at [redacted]w[redacted]d by ultrasound admitted for induction of labor due to cholestasis.  Subjective: comfortable after epidural  Objective: BP 116/71   Pulse 85   Temp 98.4 F (36.9 C) (Oral)   Resp 16   Ht 5\' 2"  (1.575 m)   Wt 104.3 kg   LMP 07/31/2019 (Exact Date)   SpO2 99%   BMI 42.07 kg/m  Notable VS details: reviewed  Fetal Assessment: Cat II tracing continues intermittently.   FHT:  FHR: 135 bpm, variability: min-mod,  accelerations:  Abscent,  decelerations:  Present early, late variable Category/reactivity:  Category II UC:   irregular, every 2-3 minutes SVE:  7/75/-2, soft/posterior. Vertex well applied, BBOW noted, AROM performed.  Membrane status: AROM at 1737 Amniotic color: clear  Labs: Lab Results  Component Value Date   WBC 9.6 04/19/2020   HGB 11.3 (L) 04/19/2020   HCT 32.6 (L) 04/19/2020   MCV 76.7 (L) 04/19/2020   PLT 293 04/19/2020    Assessment / Plan: G1 at 37.4wks, IOL for cholestasis  Labor: cytotec, Cook cath and on low dose Pitocin . AROM performed.  Preeclampsia:  no e/o Pree Fetal Wellbeing:  Category II  will monitor closely.  Pain Control:  Epidural I/D:  GBS Pos Anticipated MOD:  NSVD  06/19/2020, CNM 04/19/2020, 6:36 PM

## 2020-04-19 NOTE — Discharge Summary (Signed)
Obstetrical Discharge Summary  Patient Name: Donna Buckley DOB: 2001/07/21 MRN: [id number]  Date of Admission: 04/19/2020 Date of Delivery: 04/19/20 Delivered by: Dr Donna Buckley with  Donna Buckley CNM assist.  Date of Discharge: 04/22/2020  [facility] [id number] last menstrual period was 07/31/2019 (exact date). EDC Estimated Date of Delivery: 05/06/20 Gestational Age at Delivery: [redacted]w[redacted]d   Antepartum complications:  1. Cholestasis of pregnancy 2. Prepregnancy BMI 39.32 3. Elevated 1hr GTT -> 3hr WNL 4. GBS positive 5. Varicella Non-Immune   Admitting Diagnosis: Cholestasis of pregnancy, [redacted]wks gestation, GBS positive  Secondary Diagnosis: active phase arrest, fetal intolerance of labor, primary Low transverse cesarean.   Patient Active Problem List   Diagnosis Date Noted  . S/P cesarean section 04/22/2020  . Cholestasis during pregnancy in third trimester 04/19/2020  . Back pain complicating pregnancy 04/12/2020  . Abdominal pain in pregnancy, third trimester 03/06/2020  . Encounter for supervision of normal first pregnancy in third trimester 10/23/2019    Augmentation: AROM, Pitocin, Cytotec and Cook cath Complications: None Intrapartum complications/course: see progress and Op note for details. pLTCS for active phase arrest and fetal intolerance.  Date of Delivery: 04/19/20 Delivered By: Dr Donna Buckley Delivery Type: primary cesarean section, low transverse incision Anesthesia: epidural Placenta: manual Laceration: none Episiotomy: none Newborn Data: Live born female "Donna Buckley" Birth Weight: 6 lb 5.2 oz (2870 g) APGAR: 8, 9  Newborn Delivery   Birth date/time: 04/19/2020 22:12:00 Delivery type: C-Section, Low Transverse Trial of labor: Yes C-section categorization: Primary      Postpartum Procedures: none  Donna Buckley:  Donna Buckley Postnatal Depression Scale Screening Tool 04/20/2020 04/20/2020  I have been able to laugh and see the funny side of things. 0  (No Data)  I have looked forward with enjoyment to things. 0 -  I have blamed myself unnecessarily when things went wrong. 0 -  I have been anxious or worried for no good reason. 1 -  I have felt scared or panicky for no good reason. 0 -  Things have been getting on top of me. 1 -  I have been so unhappy that I have had difficulty sleeping. 0 -  I have felt sad or miserable. 1 -  I have been so unhappy that I have been crying. 0 -  The thought of harming myself has occurred to me. 0 -  Donna Buckley Postnatal Depression Scale Total 3 -      Post partum course- Cesarean Section:  Patient had an uncomplicated postpartum course.  By time of discharge on POD#3, her pain was controlled on oral pain medications; she had appropriate lochia and was ambulating, voiding without difficulty, tolerating regular diet and passing flatus.   She was deemed stable for discharge to home.    Discharge Physical Exam:  BP 127/77 (BP Location: Right Arm)   Pulse 88   Temp 98.5 F (36.9 C) (Oral)   Resp 20   Ht 5\' 2"  (1.575 m)   Wt 104.3 kg   LMP 07/31/2019 (Exact Date)   SpO2 100% Comment: Room Air  Breastfeeding Unknown   BMI 42.07 kg/m   General: NAD CV: RRR Pulm: CTABL, nl effort ABD: s/nd/nt, fundus firm and below the umbilicus Lochia: moderate Incision: Honeycomb dressing, c/d/i, healing well, no significant drainage, no dehiscence, no significant erythema  DVT Evaluation: LE non-ttp, no evidence of DVT on exam.  Hemoglobin  Date Value Ref Range Status  04/20/2020 10.0 (L) 12.0 - 15.0 g/dL Final   HCT  Date  Value Ref Range Status  04/20/2020 30.2 (L) 36 - 46 % Final     Disposition: stable, discharge to home. Baby Feeding: breastmilk Baby Disposition: home with mom  Rh Immune globulin given: n/a, O Pos Rubella vaccine given: immune Varicella vaccine given: NON-immune- needs prior to DC Tdap vaccine given in AP or PP setting: 02/12/20 Flu vaccine given in AP or PP setting:  due  Contraception: POPs  Prenatal Labs:  Blood type/Rh O pos  Antibody screen neg  Rubella Immune  Varicella Non-immune  RPR NR  HBsAg Neg  HIV NR  GC neg  Chlamydia neg  Genetic screening negative  1 hour GTT 136  3 hour GTT 86, 121, 101, 92  GBS Positive       Plan:  Donna Buckley was discharged to home in good condition. Follow-up appointment with prenatal clinic in 2 weeks.  Discharge Medications: Allergies as of 04/22/2020   No Known Allergies     Medication List    TAKE these medications   cyclobenzaprine 10 MG tablet Commonly known as: FLEXERIL Take 1 tablet (10 mg total) by mouth every 8 (eight) hours as needed for muscle spasms (back pain).   ibuprofen 800 MG tablet Commonly known as: ADVIL Take 1 tablet (800 mg total) by mouth every 8 (eight) hours.   oxyCODONE-acetaminophen 5-325 MG tablet Commonly known as: PERCOCET/ROXICET Take 1-2 tablets by mouth every 4 (four) hours as needed for up to 7 days for moderate pain or severe pain.   prenatal multivitamin Tabs tablet Take 1 tablet by mouth daily at 12 noon.        Follow-up Information    Buckley, Donna Buckley, CNM. Schedule an appointment as soon as possible for a visit in 6 week(s).   Specialty: Obstetrics and Gynecology Contact information: [address] [place] [address] [phone]               Signed:  Cyril Buckley, CNM 04/22/2020  9:21 AM

## 2020-04-19 NOTE — Op Note (Signed)
Preoperative Diagnosis: 1) 19 y.o. G1P0 at [redacted]w[redacted]d with failure to progress 2) Category II tracing 3) Obesity Body mass index is 42.07 kg/m.  4) Cholestasis of pregnancy  Postoperative Diagnosis: 1) 19 y.o. G1P0 at [redacted]w[redacted]d at [redacted]w[redacted]d with failure to progress 2) Category II tracing 3) Obesity Body mass index is 42.07 kg/m. 4) Cholestasis of pregnancy  Operation Performed: Primary low transverse C-section via pfannenstiel skin incision  Indication: Failure to progress with arrest of dilation at 7cm despite adequate MVU.  Fetus OP presentation.  Anesthesia: Epidural  Primary Surgeon: Vena Austria, MD  Assistant: Heloise Ochoa, CNM this surgery required a high level surgical assistant with none other readily available.    Preoperative Antibiotics: 2g ancef and 500mg  of azithromycin   Estimated Blood Loss: 600 mL  IV Fluids: 1L  Urine Output:: [id number]  Drains or Tubes: Foley to gravity drainage, ON-Q catheter system  Implants: none  Specimens Removed: none  Complications: none  Intraoperative Findings:  Normal tubes ovaries and uterus.  Delivery resulted in the birth of a liveborn female, APGAR (1 MIN): 8   APGAR (5 MINS): 9, weight 6lbs 5oz  Patient Condition: stable  Procedure in Detail:  Patient was taken to the operating room were she was administered regional anesthesia.  She was positioned in the supine position, prepped and draped in the  Usual sterile fashion.  Prior to proceeding with the case a time out was performed and the level of anesthetic was checked and noted to be adequate.  Utilizing the scalpel a pfannenstiel skin incision was made 2cm above the pubic symphysis and carried down sharply to the the level of the rectus fascia.  The fascia was incised in the midline using the scalpel and then extended using mayo scissors.  The superior border of the rectus fascia was grasped with two Kocher clamps and the underlying rectus muscles were dissected of the fascia using  blunt dissection.  The median raphae was incised using Mayo scissors.   The inferior border of the rectus fascia was dissected of the rectus muscles in a similar fashion.  The midline was identified, the peritoneum was entered bluntly and expanded using manual tractions.  The uterus was noted to be in a none rotated position.  Next the bladder blade was placed retracting the bladder caudally.  A bladder flap was not created.  A low transverse incision was scored on the lower uterine segment.  The hysterotomy was entered bluntly using the operators finger.  The hysterotomy incision was extended using manual traction.  The operators hand was placed within the hysterotomy position noting the fetus to be within the OP position.  The vertex was grasped, flexed, brought to the incision, and delivered a traumatically using fundal pressure applied by , CNM.  The remainder of the body delivered with ease.  The infant was suctioned, cord was clamped and cut before handing off to the awaiting neonatologist.  The placenta was delivered using manual extraction.  The uterus was exteriorized, wiped clean of clots and debris using two moist laps.  The hysterotomy was closed using a two layer closure of 0 Vicryl, with the first being a running locked, the second a vertical imbricating.  The uterus was returned to the abdomen.  The peritoneal gutters were wiped clean of clots and debris using two moist laps.  The hysterotomy incision was re-inspected noted to be hemostatic.  The rectus muscles were inspected noted to be hemostatic.  The superior border of the rectus fascia  was grasped with a Kocher clamp.  The ON-Q trocars were then placed 4cm above the superior border of the incision and tunneled subfascially.  The introducers were removed and the catheters were threaded through the sleeves after which the sleeves were removed.  The fascia was closed using a looped #1 PDS in a running fashion taking 1cm by 1cm bites.   The subcutaneous tissue was irrigated using warm saline, hemostasis achieved using the bovie.  The subcutaneous dead space was less than 3cm and was not closed.  The skin was closed using 4-0 Monocryl in a subcuticular fashion.  Sponge needle and instrument counts were corrects times two.  The patient tolerated the procedure well and was taken to the recovery room in stable condition.

## 2020-04-19 NOTE — Progress Notes (Signed)
Pt requested epidural, anesthesia called 1220. Dr. Hal Hope in room at 1251. 1% local 1259 Test dose 1305 Heart rate 92 Bolus#1 1309 Bolus #2 Drip started by Dr. Noralyn Pick after assessing BP and response to bolus. Pt comfortable and in stable condition.

## 2020-04-19 NOTE — Progress Notes (Signed)
Provider notified of repetitive late decelerations even after multiple position changes and IV fluid bolus. Verbal order for terbutaline subq given. Will continue to monitor.

## 2020-04-19 NOTE — Anesthesia Procedure Notes (Signed)
Epidural Patient location during procedure: OB Start time: 04/19/2020 12:57 PM End time: 04/19/2020 1:09 PM  Staffing Anesthesiologist: Yves Dill, MD Performed: anesthesiologist   Preanesthetic Checklist Completed: patient identified, IV checked, site marked, risks and benefits discussed, surgical consent, monitors and equipment checked, pre-op evaluation and timeout performed  Epidural Patient position: sitting Prep: Betadine Patient monitoring: heart rate, continuous pulse ox and blood pressure Approach: midline Location: L3-L4 Injection technique: LOR air  Needle:  Needle type: Tuohy  Needle gauge: 17 G Needle length: 9 cm and 9 Catheter type: closed end flexible Catheter size: 19 Gauge Test dose: negative and 1.5% lidocaine with Epi 1:200 K  Assessment Sensory level: T8 Events: blood not aspirated, injection not painful, no injection resistance, no paresthesia and negative IV test  Additional Notes Time out called.  Patient placed in sitting position.  Back prepped and draped in sterile fashion.  A skin wheal was made in the L3-L4 interspace with 1% Lidocaine plain.  A 17G Tuohy needle was advanced into the epidural space by a loss of resistance technique.  The epidural catheter was threaded 3 cm and the TD was negative.  No blood, fluid or paresthesias.  The patient tolerated the procedure well and the catheter was affixed to the back in sterile fashion.Reason for block:procedure for pain

## 2020-04-19 NOTE — Progress Notes (Signed)
Labor Progress Note  Donna Buckley is a 19 y.o. G1P0 at [redacted]w[redacted]d by ultrasound admitted for induction of labor due to cholestasis.  Subjective: comfortable after epidural  Objective: BP 113/65   Pulse 85   Temp 98.4 F (36.9 C) (Oral)   Resp 16   Ht 5\' 2"  (1.575 m)   Wt 104.3 kg   LMP 07/31/2019 (Exact Date)   SpO2 99%   BMI 42.07 kg/m  Notable VS details: reviewed  Fetal Assessment: variables with late component noted immediately after epidural, Cat II tracing, resolved with maternal position changes and intrauterine resuscitation.  FHT:  FHR: 130 bpm, variability: moderate,  accelerations:  Present,  decelerations:  Absent Category/reactivity:  Category I UC:   irregular, every 2-3 minutes SVE:   5cm/50/-3, soft/posterior. Vertex not well applied, Cook cath removed intact.  Membrane status: intact Amniotic color: n/a  Labs: Lab Results  Component Value Date   WBC 9.6 04/19/2020   HGB 11.3 (L) 04/19/2020   HCT 32.6 (L) 04/19/2020   MCV 76.7 (L) 04/19/2020   PLT 293 04/19/2020    Assessment / Plan: G1 at 37.4wks, IOL for cholestasis  Labor: cytotec, Cook cath and will start Pitocin now.  Preeclampsia:  no e/o Pree Fetal Wellbeing:  Category I now, will monitor closely.  Pain Control:  Epidural I/D:  GBS Pos Anticipated MOD:  NSVD  06/19/2020 Hanif Radin, CNM 04/19/2020, 2:32 PM

## 2020-04-19 NOTE — Transfer of Care (Signed)
Immediate Anesthesia Transfer of Care Note  Patient: Donna Buckley  Procedure(s) Performed: CESAREAN SECTION (N/A )  Patient Location: Mother/Baby  Anesthesia Type:Epidural  Level of Consciousness: awake, alert  and oriented  Airway & Oxygen Therapy: Patient Spontanous Breathing  Post-op Assessment: Report given to RN and Post -op Vital signs reviewed and stable  Post vital signs: Reviewed and stable  Last Vitals:  Vitals Value Taken Time  BP 132/68 04/19/20 2310  Temp    Pulse    Resp 20 04/19/20 2310  SpO2 98 % 04/19/20 2310    Last Pain:  Vitals:   04/19/20 1015  TempSrc:   PainSc: 8          Complications: No complications documented.

## 2020-04-20 ENCOUNTER — Encounter: Payer: Self-pay | Admitting: Obstetrics and Gynecology

## 2020-04-20 LAB — CBC
HCT: 30.2 % — ABNORMAL LOW (ref 36.0–46.0)
Hemoglobin: 10 g/dL — ABNORMAL LOW (ref 12.0–15.0)
MCH: 26.2 pg (ref 26.0–34.0)
MCHC: 33.1 g/dL (ref 30.0–36.0)
MCV: 79.1 fL — ABNORMAL LOW (ref 80.0–100.0)
Platelets: 254 10*3/uL (ref 150–400)
RBC: 3.82 MIL/uL — ABNORMAL LOW (ref 3.87–5.11)
RDW: 14.9 % (ref 11.5–15.5)
WBC: 13.8 10*3/uL — ABNORMAL HIGH (ref 4.0–10.5)
nRBC: 0 % (ref 0.0–0.2)

## 2020-04-20 MED ORDER — DIBUCAINE (PERIANAL) 1 % EX OINT: 1.0000 "application " | TOPICAL_OINTMENT | CUTANEOUS | Status: DC | PRN

## 2020-04-20 MED ORDER — SIMETHICONE 80 MG PO CHEW
80.0000 mg | CHEWABLE_TABLET | Freq: Three times a day (TID) | ORAL | Status: DC
  Administered 2020-04-20 – 2020-04-22 (×7): 80 mg via ORAL
  Filled 2020-04-20 (×7): qty 1

## 2020-04-20 MED ORDER — SENNOSIDES-DOCUSATE SODIUM 8.6-50 MG PO TABS
2.0000 | ORAL_TABLET | ORAL | Status: DC
  Administered 2020-04-20: 2 via ORAL
  Filled 2020-04-20: qty 2

## 2020-04-20 MED ORDER — WITCH HAZEL-GLYCERIN EX PADS: 1.0000 "application " | MEDICATED_PAD | CUTANEOUS | Status: DC | PRN

## 2020-04-20 MED ORDER — MORPHINE SULFATE (PF) 2 MG/ML IV SOLN: 1.0000 mg | INTRAVENOUS | Status: DC | PRN

## 2020-04-20 MED ORDER — SIMETHICONE 80 MG PO CHEW
80.0000 mg | CHEWABLE_TABLET | ORAL | Status: DC
  Administered 2020-04-20 – 2020-04-21 (×2): 80 mg via ORAL
  Filled 2020-04-20 (×2): qty 1

## 2020-04-20 MED ORDER — PRENATAL MULTIVITAMIN CH
1.0000 | ORAL_TABLET | Freq: Every day | ORAL | Status: DC
  Administered 2020-04-20 – 2020-04-22 (×3): 1 via ORAL
  Filled 2020-04-20 (×3): qty 1

## 2020-04-20 MED ORDER — ENOXAPARIN SODIUM 40 MG/0.4ML ~~LOC~~ SOLN
40.0000 mg | SUBCUTANEOUS | Status: DC
  Filled 2020-04-20: qty 0.4

## 2020-04-20 MED ORDER — SIMETHICONE 80 MG PO CHEW: 80.0000 mg | CHEWABLE_TABLET | ORAL | Status: DC | PRN

## 2020-04-20 MED ORDER — OXYTOCIN-SODIUM CHLORIDE 30-0.9 UT/500ML-% IV SOLN: 2.5000 [IU]/h | INTRAVENOUS | Status: DC

## 2020-04-20 MED ORDER — TETANUS-DIPHTH-ACELL PERTUSSIS 5-2.5-18.5 LF-MCG/0.5 IM SUSP: 0.5000 mL | Freq: Once | INTRAMUSCULAR | Status: DC

## 2020-04-20 MED ORDER — MENTHOL 3 MG MT LOZG
1.0000 | LOZENGE | OROMUCOSAL | Status: DC | PRN
  Filled 2020-04-20: qty 9

## 2020-04-20 MED ORDER — ZOLPIDEM TARTRATE 5 MG PO TABS: 5.0000 mg | ORAL_TABLET | Freq: Every evening | ORAL | Status: DC | PRN

## 2020-04-20 MED ORDER — NALBUPHINE HCL 10 MG/ML IJ SOLN: 2.5000 mg | Freq: Four times a day (QID) | INTRAMUSCULAR | Status: DC | PRN

## 2020-04-20 MED ORDER — DIPHENHYDRAMINE HCL 25 MG PO CAPS: 25.0000 mg | ORAL_CAPSULE | Freq: Four times a day (QID) | ORAL | Status: DC | PRN

## 2020-04-20 MED ORDER — COCONUT OIL OIL
1.0000 "application " | TOPICAL_OIL | Status: DC | PRN
  Administered 2020-04-20: 1 via TOPICAL
  Filled 2020-04-20: qty 120

## 2020-04-20 MED ORDER — OXYCODONE-ACETAMINOPHEN 5-325 MG PO TABS: 1.0000 | ORAL_TABLET | ORAL | Status: DC | PRN

## 2020-04-20 MED ORDER — ACETAMINOPHEN 500 MG PO TABS
1000.0000 mg | ORAL_TABLET | Freq: Four times a day (QID) | ORAL | Status: DC | PRN
  Administered 2020-04-20 – 2020-04-21 (×5): 1000 mg via ORAL
  Filled 2020-04-20 (×5): qty 2

## 2020-04-20 MED ORDER — SIMETHICONE 80 MG PO CHEW: 80.0000 mg | CHEWABLE_TABLET | ORAL | Status: DC

## 2020-04-20 MED ORDER — SENNOSIDES-DOCUSATE SODIUM 8.6-50 MG PO TABS
2.0000 | ORAL_TABLET | ORAL | Status: DC
  Administered 2020-04-21 – 2020-04-22 (×2): 2 via ORAL
  Filled 2020-04-20 (×2): qty 2

## 2020-04-20 MED ORDER — LACTATED RINGERS IV SOLN: INTRAVENOUS | Status: DC

## 2020-04-20 NOTE — Discharge Instructions (Signed)
Please call your doctor or return to the ER if you experience any chest pains, shortness of breath, dizziness, visual changes, severe headache (unrelieved by pain meds), fever greater than 101, any heavy bleeding (saturating more than 1 pad per hour), large clots, or foul smelling discharge, any worsening abdominal pain and cramping that is not controlled by pain medication, any calf/leg pain or redness, any breast concerns (redness/pain), or any signs of postpartum depression. No tampons, enemas, douches, or sexual intercourse for 6 weeks. Also avoid tub baths, hot tubs, or swimming for 6 weeks.    Check your incision daily for any signs of infection such as redness, warmth, swelling, increased pain, our pus/foul smelling drainge   Activity: do not lift over 10-15 lbs for 6 weeks  No driving for 1-2 weeks  Pelvic rest for 6 weeks

## 2020-04-20 NOTE — Lactation Note (Signed)
This note was copied from a baby's chart. Lactation Consultation Note  Patient Name: Donna Buckley [id number] Date: 04/20/2020 Reason for consult: Follow-up assessment;Difficult latch;Primapara;Early term 37-38.6wks;Other (Comment) (Poor breast feeder)  This was an improved feeding from the previous feedings with mom sitting up in the chair and Donna Buckley in the football hold using #20 nipple shield.  Donna Buckley was still sleepy requiring breast massage and frequent stimulation to keep him sucking.  Mom's right nipple was slightly more everted in the #20 nipple shield, but still the areola is hard and difficult to compress.  Donna Buckley sucked for longer interval at this feeding.  Mom felt like this was the best breast feed so far and the tugs at the breast were stronger.  Mom was a little less sleepy and more involved for this breast feed.  FOB was closely observing breast feed so he could help through the night.  Mom felt cramping when he started sucking stronger at the breast.  When discussed mom getting DEBP through her insurance, mom reports already having bought a [employer].   Maternal Data Formula Feeding for Exclusion: No Has patient been taught Hand Expression?: Yes Does the patient have breastfeeding experience prior to this delivery?: No (Gr1)  Feeding Feeding Type: Breast Fed  LATCH Score Latch: Repeated attempts needed to sustain latch, nipple held in mouth throughout feeding, stimulation needed to elicit sucking reflex.  Audible Swallowing: A few with stimulation  Type of Nipple: Everted at rest and after stimulation (Firm areola with some difficulty to compresss)  Comfort (Breast/Nipple): Filling, red/small blisters or bruises, mild/mod discomfort  Hold (Positioning): Assistance needed to correctly position infant at breast and maintain latch.  LATCH Score: 6  Interventions Interventions: Assisted with latch;Breast massage;Hand express;Reverse pressure;Breast compression;Adjust  position;Support pillows;Position options;Coconut oil;Comfort gels  Lactation Tools Discussed/Used Tools: Coconut oil;Comfort gels;Nipple Shields Nipple shield size: 20 WIC Program: No Futures trader)   Consult Status Consult Status: Follow-up Follow-up type: Call as needed    Louis Meckel 04/20/2020, 7:24 PM

## 2020-04-20 NOTE — Progress Notes (Signed)
Post Partum Day 1 Subjective: Doing well, no complaints.  Tolerating regular diet, pain with PO meds.   No CP SOB Fever,Chills, N/V or leg pain; denies nipple or breast pain, no HA change of vision, RUQ/epigastric pain  Objective: BP 133/82 (BP Location: Right Arm)   Pulse (!) 104   Temp 97.9 F (36.6 C) (Oral)   Resp 18   Ht 5\' 2"  (1.575 m)   Wt 104.3 kg   LMP 07/31/2019 (Exact Date)   SpO2 96%   Breastfeeding Unknown   BMI 42.07 kg/m    Physical Exam:  General: NAD Breasts: soft/nontender CV: RRR Pulm: nl effort, CTABL Abdomen: soft, NT, BS x 4 Incision: Honeycomb dsg CDI, OnQ pump  Lochia: moderate Uterine Fundus: fundus firm and 2 fb below umbilicus DVT Evaluation: no cords, ttp LEs   Recent Labs    04/19/20 0042 04/20/20 0636  HGB 11.3* 10.0*  HCT 32.6* 30.2*  WBC 9.6 13.8*  PLT 293 254    Assessment/Plan: 19 y.o. G1P1001 postpartum day # 1  - Continue routine PP care- plan for foley DC and ambulation today.  - Lactation consult prn.  - Acute blood loss anemia - hemodynamically stable and asymptomatic; continue po ferrous sulfate BID with stool softeners  - Immunization status: Needs varicella prior to DC  Disposition: Does not desire Dc home today.     06/20/20, CNM 04/20/2020  9:32 AM

## 2020-04-20 NOTE — Lactation Note (Signed)
This note was copied from a baby's chart. Lactation Consultation Note  Patient Name: Donna Buckley [id number] Date: 04/20/2020   [name] was born at 33.4 and had not breast feed in almost 8 hours.  When went in room mom had him wrapped in warm blankets.  Took off the blankets and put him skin to skin with mom near the left breast with pillow support.  He began to stir a little.  Mom says she wants to breast feed, but is very sleepy and so is [name].  Demonstrated hand expression and only got a drop to entice him to latch.  Mom has large breasts and mom's nipples are slightly flat, but has compressible nipple tissue.  It took several attempts with gentle lowering of the chin to get wide open mouth for deep enough latch.  Mom was not aggressive.  FOB seems supportive.  Lactation demonstrated how to sandwich breast and obtain latch.  He finally began to suck.  Mom reports feeling the tugs at the breast, but they were not continuous.  Explained it was normal in the beginning for [name] to be drowzy and to keep falling asleep at the breast.  Demonstrated how to massage breast and stimulate [name] to keep him actively sucking at the breast.  Mom grimaced and moaned when hand expressed and when he latched initially.    He came off the breast on his own after 10 minutes with tight jaw refusing to re latch.  Coconut oil and comfort gels given and instructed in alternating use.  Explained normal newborn stomach size, feeding cues, importance of skin to skin and supply and demand encouraging mom to put [name] to the breast whenever he demonstrated hunger cues.  Handout given on what to expect with feeding the first 4 days of life and reviewed adequate intake and out put, normal course of lactation and routine newborn feeding patterns.  Lactation [employer] given and reviewed.  Lactation name and number written on white board and encouraged to call with any questions, concerns or if needed assistance with breast  feeds.      Maternal Data    Feeding    LATCH Score                   Interventions    Lactation Tools Discussed/Used     Consult Status      Louis Meckel 04/20/2020, 3:15 PM

## 2020-04-20 NOTE — Plan of Care (Addendum)
Transferred to Room 339 PP Alert and oriented with aporp. Affect. Color good, skin w&d. Oriented to Room, Safety and Security and POC. V/O. Foley cathter intact upon arrival to Unit.

## 2020-04-20 NOTE — Anesthesia Post-op Follow-up Note (Signed)
  Anesthesia Pain Follow-up Note  Patient: Aranda Bihm  Day #: 1  Date of Follow-up: 04/20/2020 Time: 3:47 PM  Last Vitals:  Vitals:   04/20/20 1245 04/20/20 1300  BP: 112/65   Pulse: 87 98  Resp: 20   Temp: 36.7 C   SpO2: 100% 96%    Level of Consciousness: alert  Pain: mild   Side Effects:None  Catheter Site Exam:clean, dry  Anti-Coag Meds (From admission, onward)   Start     Dose/Rate Route Frequency Ordered Stop   04/20/20 2300  enoxaparin (LOVENOX) injection 40 mg        40 mg Subcutaneous Every 24 hours 04/20/20 0131         Plan: D/C from anesthesia care at surgeon's request  Lenard Simmer

## 2020-04-20 NOTE — Anesthesia Postprocedure Evaluation (Signed)
Anesthesia Post Note  Patient: Donna Buckley  Procedure(s) Performed: CESAREAN SECTION (N/A )  Patient location during evaluation: Mother Baby Anesthesia Type: Epidural Level of consciousness: awake and alert Pain management: pain level controlled Vital Signs Assessment: post-procedure vital signs reviewed and stable Respiratory status: spontaneous breathing, nonlabored ventilation and respiratory function stable Cardiovascular status: stable Postop Assessment: no headache, no backache, able to ambulate, no apparent nausea or vomiting and adequate PO intake Anesthetic complications: no   No complications documented.   Last Vitals:  Vitals:   04/20/20 1245 04/20/20 1300  BP: 112/65   Pulse: 87 98  Resp: 20   Temp: 36.7 C   SpO2: 100% 96%    Last Pain:  Vitals:   04/20/20 1246  TempSrc:   PainSc: 1                  Lenard Simmer

## 2020-04-21 MED ORDER — VARICELLA VIRUS VACCINE LIVE 1350 PFU/0.5ML IJ SUSR
0.5000 mL | INTRAMUSCULAR | Status: DC | PRN
  Filled 2020-04-21: qty 0.5

## 2020-04-21 MED ORDER — FERROUS SULFATE 325 (65 FE) MG PO TABS
325.0000 mg | ORAL_TABLET | Freq: Two times a day (BID) | ORAL | Status: DC
  Administered 2020-04-21 – 2020-04-22 (×3): 325 mg via ORAL
  Filled 2020-04-21 (×3): qty 1

## 2020-04-21 NOTE — Progress Notes (Signed)
Post Partum Day 2  Subjective: no complaints, up ad lib, voiding and tolerating PO  Doing well, no concerns. Ambulating without difficulty, pain managed with PO meds, tolerating regular diet, and voiding without difficulty.   No fever/chills, chest pain, shortness of breath, nausea/vomiting, or leg pain. No nipple or breast pain. No headache, visual changes, or RUQ/epigastric pain.  Objective: BP 117/73 (BP Location: Right Arm)   Pulse 71   Temp 98.6 F (37 C) (Oral)   Resp 20   Ht 5\' 2"  (1.575 m)   Wt 104.3 kg   LMP 07/31/2019 (Exact Date)   SpO2 100% Comment: room air  Breastfeeding Unknown   BMI 42.07 kg/m    Physical Exam:  General: alert, cooperative and no distress Breasts: soft/nontender CV: RRR Pulm: nl effort, CTABL Abdomen: soft, non-tender, active bowel sounds Uterine Fundus: firm Incision: no significant drainage Perineum: minimal edema, intact Lochia: appropriate DVT Evaluation: No evidence of DVT seen on physical exam.  Recent Labs    04/19/20 0042 04/20/20 0636  HGB 11.3* 10.0*  HCT 32.6* 30.2*  WBC 9.6 13.8*  PLT 293 254    Assessment/Plan: 19 y.o. G1P1001 postpartum day # 2  -Continue routine postpartum care -Lactation consult PRN for breastfeeding   -Discussed contraceptive options including implant, IUDs hormonal and non-hormonal, injection, pills/ring/patch, condoms, and NFP.  -Acute blood loss anemia - hemodynamically stable and asymptomatic; start PO ferrous sulfate BID with stool softeners  -Immunization status: Needs varicella prior to discharge  Disposition: Continue inpatient postpartum care    LOS: 2 days   06/20/20, CNM 04/21/2020, 8:46 AM   ----- 06/21/2020  Certified Nurse Midwife [facility]

## 2020-04-21 NOTE — Lactation Note (Signed)
This note was copied from a baby's chart. Lactation Consultation Note  Patient Name: Donna Buckley [id number] Date: 04/21/2020   Lactation follow-up.  Mom feeling well, sitting on edge of bed. Dad just fed baby formula, approximately 75mL. Mom feels she is more comfortable with pumping and bottle feeding. LC acknowledged mom's feelings and decision and provided education on pumping routine/schedule, use of hand expression before and after pumping, and warmth and breast massage while pumping in efforts of achieving optimal output. Milk supply and demand reviewed as well as timing and normal course of lactation. Discussed ways for baby to receive all expressed breastmilk prior to formula supplementation. Mom has her own DEBP at home; discussed pumping concepts that will be relatable once using her own pump, and determining proper size and fit of flanges for her pump at home. [facility] name updated on whiteboard and mom encouraged to call out with questions or for any assistance.  Maternal Data    Feeding    LATCH Score                   Interventions    Lactation Tools Discussed/Used     Consult Status      Danford Bad 04/21/2020, 1:44 PM

## 2020-04-22 DIAGNOSIS — Z98891 History of uterine scar from previous surgery: Secondary | ICD-10-CM

## 2020-04-22 MED ORDER — IBUPROFEN 800 MG PO TABS: 800.0000 mg | ORAL_TABLET | Freq: Three times a day (TID) | ORAL | 0 refills | Status: DC

## 2020-04-22 MED ORDER — OXYCODONE-ACETAMINOPHEN 5-325 MG PO TABS: 1.0000 | ORAL_TABLET | ORAL | 0 refills | Status: AC | PRN

## 2020-04-22 NOTE — Progress Notes (Signed)
Patient discharged home with infant. Discharge instructions, prescriptions and follow up appointment given to and reviewed with patient. Patient verbalized understanding. Pt wheeled out by auxiliary. Incision kit given

## 2020-04-22 NOTE — Lactation Note (Signed)
This note was copied from a baby's chart. Lactation Consultation Note  Patient Name: Donna Buckley [id number] Date: 04/22/2020 Reason for consult: Follow-up assessment;1st time breastfeeding;Early term 37-38.6wks  Lactation follow-up before discharge. Mom continuing to pump, going longer periods between pumping at night. Feeding plan is to pump and bottle feed, decreasing formula given as breastmilk volume increases. LC reiterated importance of pumping 8-12x/24hrs, use of warmth and breast compression and massage while pumping, transition to her personal use DEBP, and lactation support post discharge.   Maternal Data Formula Feeding for Exclusion: No Has patient been taught Hand Expression?: Yes Does the patient have breastfeeding experience prior to this delivery?: No  Feeding    LATCH Score                   Interventions Interventions: Breast feeding basics reviewed;DEBP  Lactation Tools Discussed/Used Tools: Pump;Flanges;Coconut oil;Comfort gels;Nipple Dorris Carnes   Consult Status Consult Status: Complete Date: 04/22/20 Follow-up type: Call as needed    Danford Bad 04/22/2020, 11:13 AM

## 2020-06-16 ENCOUNTER — Emergency Department: Payer: Medicaid Other

## 2020-06-16 ENCOUNTER — Emergency Department
Admission: EM | Admit: 2020-06-16 | Discharge: 2020-06-16 | Disposition: A | Discharge status: Home / Self Care | Payer: Medicaid Other | Attending: Emergency Medicine | Admitting: Emergency Medicine

## 2020-06-16 ENCOUNTER — Other Ambulatory Visit: Payer: Self-pay

## 2020-06-16 DIAGNOSIS — K8020 Calculus of gallbladder without cholecystitis without obstruction: Secondary | ICD-10-CM

## 2020-06-16 DIAGNOSIS — R109 Unspecified abdominal pain: Secondary | ICD-10-CM

## 2020-06-16 DIAGNOSIS — R1011 Right upper quadrant pain: Secondary | ICD-10-CM | POA: Diagnosis present

## 2020-06-16 DIAGNOSIS — R7401 Elevation of levels of liver transaminase levels: Secondary | ICD-10-CM | POA: Diagnosis not present

## 2020-06-16 LAB — COMPREHENSIVE METABOLIC PANEL
ALT: 141 U/L — ABNORMAL HIGH (ref 0–44)
AST: 217 U/L — ABNORMAL HIGH (ref 15–41)
Albumin: 3.5 g/dL (ref 3.5–5.0)
Alkaline Phosphatase: 125 U/L (ref 38–126)
Anion gap: 11 (ref 5–15)
BUN: <5 mg/dL — ABNORMAL LOW (ref 6–20)
CO2: 25 mmol/L (ref 22–32)
Calcium: 8.8 mg/dL — ABNORMAL LOW (ref 8.9–10.3)
Chloride: 102 mmol/L (ref 98–111)
Creatinine, Ser: 0.79 mg/dL (ref 0.44–1.00)
GFR, Estimated: >60 mL/min (ref >60)
Glucose, Bld: 114 mg/dL — ABNORMAL HIGH (ref 70–99)
Potassium: 3.8 mmol/L (ref 3.5–5.1)
Sodium: 138 mmol/L (ref 135–145)
Total Bilirubin: 1.9 mg/dL — ABNORMAL HIGH (ref 0.3–1.2)
Total Protein: 8 g/dL (ref 6.5–8.1)

## 2020-06-16 LAB — LIPASE, BLOOD: Lipase: 33 U/L (ref 11–51)

## 2020-06-16 LAB — URINALYSIS, COMPLETE (UACMP) WITH MICROSCOPIC
Bacteria, UA: NONE SEEN
Bilirubin Urine: NEGATIVE
Glucose, UA: NEGATIVE mg/dL
Hgb urine dipstick: NEGATIVE
Ketones, ur: NEGATIVE mg/dL
Leukocytes,Ua: NEGATIVE
Nitrite: NEGATIVE
Protein, ur: NEGATIVE mg/dL
Specific Gravity, Urine: 1.014 (ref 1.005–1.030)
pH: 8 (ref 5.0–8.0)

## 2020-06-16 LAB — POC URINE PREG, ED: Preg Test, Ur: NEGATIVE

## 2020-06-16 LAB — CBC
HCT: 37.5 % (ref 36.0–46.0)
Hemoglobin: 12 g/dL (ref 12.0–15.0)
MCH: 24.8 pg — ABNORMAL LOW (ref 26.0–34.0)
MCHC: 32 g/dL (ref 30.0–36.0)
MCV: 77.6 fL — ABNORMAL LOW (ref 80.0–100.0)
Platelets: 382 10*3/uL (ref 150–400)
RBC: 4.83 MIL/uL (ref 3.87–5.11)
RDW: 14.8 % (ref 11.5–15.5)
WBC: 4.3 10*3/uL (ref 4.0–10.5)
nRBC: 0 % (ref 0.0–0.2)

## 2020-06-16 MED ORDER — KETOROLAC TROMETHAMINE 10 MG PO TABS: 10.0000 mg | ORAL_TABLET | Freq: Four times a day (QID) | ORAL | 0 refills | Status: DC | PRN

## 2020-06-16 MED ORDER — LACTATED RINGERS IV BOLUS
1000.0000 mL | Freq: Once | INTRAVENOUS | Status: AC
  Administered 2020-06-16: 1000 mL via INTRAVENOUS

## 2020-06-16 MED ORDER — ONDANSETRON 4 MG PO TBDP: 4.0000 mg | ORAL_TABLET | Freq: Three times a day (TID) | ORAL | 0 refills | Status: DC | PRN

## 2020-06-16 MED ORDER — GADOBUTROL 1 MMOL/ML IV SOLN
9.0000 mL | Freq: Once | INTRAVENOUS | Status: AC | PRN
  Administered 2020-06-16: 9 mL via INTRAVENOUS

## 2020-06-16 MED ORDER — ONDANSETRON HCL 4 MG/2ML IJ SOLN
4.0000 mg | Freq: Once | INTRAMUSCULAR | Status: AC
  Administered 2020-06-16: 4 mg via INTRAVENOUS
  Filled 2020-06-16: qty 2

## 2020-06-16 MED ORDER — MORPHINE SULFATE (PF) 4 MG/ML IV SOLN
4.0000 mg | Freq: Once | INTRAVENOUS | Status: AC
  Administered 2020-06-16: 4 mg via INTRAVENOUS
  Filled 2020-06-16: qty 1

## 2020-06-16 NOTE — Discharge Instructions (Signed)
Your ultrasound shows multiple small gallstones in your gallbladder. You should follow up with surgery clinic for further evaluation of this issue.  In the meantime, stick to a bland, low fat diet and stay well-hydrated to minimize further painful gallbladder attacks.  Return to the ER if you have fever, persistent vomiting and inability to eat or drink, or severe unremitting pain.

## 2020-06-16 NOTE — ED Triage Notes (Signed)
Pt reports having pain in her epigastric area and right side and right shoulder over night, pt states that she has had intermittent episodes, but this caused her to be nauseated and vomit, pt was seen at urgent care and told she had elevated LFT's and to be seen in the ER, pt states that she still has her gallbladder

## 2020-06-16 NOTE — ED Notes (Signed)
Po challenge completed. Pt denies nausea

## 2020-06-16 NOTE — ED Provider Notes (Signed)
[facility] Emergency Department Provider Note  ____________________________________________   First MD Initiated Contact with Patient 06/16/20 1809     (approximate)  I have reviewed the triage vital signs and the nursing notes.   HISTORY  Chief Complaint Abdominal Pain and Abnormal Lab   HPI Donna Buckley is a 19 y.o. female with no significant past medical history who presents for assessment of right upper quadrant abdominal pain.  Patient states that this has been present for several weeks and has been episodic with episodes of been increasing in frequency.  She states she had a severe episode of right upper quadrant pain last night that initially seemed to get better and she was able to fall asleep but then got significantly worse again today.  Patient states she has had some nausea and did vomit.  There is no blood or green vomit.  No diarrhea while patient does endorse some intermittent burning with urination.  She states the pain radiates to her mid back.  She denies any headache, earache, sore throat, fevers, chills, cough, chest pain, shortness of breath, rash, vaginal bleeding or discharge, or extremity pain.  She does note she is proximally 2 months postpartum.  No prior synopsis.  No clearly feeding aggravating factors aside from eating which seems to make the episodes more severe and any palpation of her right upper quadrant.  She has she was initially urgent care center the emergency room because her liver enzymes were abnormal.         Past Medical History:  Diagnosis Date  . Medical history non-contributory     Patient Active Problem List   Diagnosis Date Noted  . S/P cesarean section 04/22/2020  . Cholestasis during pregnancy in third trimester 04/19/2020  . Back pain complicating pregnancy 16/05/9603  . Abdominal pain in pregnancy, third trimester 03/06/2020  . Encounter for supervision of normal first pregnancy in third trimester  10/23/2019    Past Surgical History:  Procedure Laterality Date  . CESAREAN SECTION N/A 04/19/2020   Procedure: CESAREAN SECTION;  Surgeon: Malachy Mood, MD;  Location: [facility] ORS;  Service: Obstetrics;  Laterality: N/A;  . NO PAST SURGERIES      Prior to Admission medications   Medication Sig Start Date End Date Taking? Authorizing Provider  JUNEL 1/20 1-20 MG-MCG tablet Take 1 tablet by mouth daily. 06/04/20  Yes [provider]    Allergies Patient has no known allergies.  No family history on file.  Social History Social History   Tobacco Use  . Smoking status: Never Smoker  . Smokeless tobacco: Never Used  Vaping Use  . Vaping Use: Never used  Substance Use Topics  . Alcohol use: Not Currently  . Drug use: Never    Review of Systems  Review of Systems  Constitutional: Negative for chills and fever.  HENT: Negative for sore throat.   Eyes: Negative for pain.  Respiratory: Negative for cough and stridor.   Cardiovascular: Negative for chest pain.  Gastrointestinal: Positive for abdominal pain, nausea and vomiting.  Skin: Negative for rash.  Neurological: Negative for seizures, loss of consciousness and headaches.  Psychiatric/Behavioral: Negative for suicidal ideas.  All other systems reviewed and are negative.     ____________________________________________   PHYSICAL EXAM:  VITAL SIGNS: ED Triage Vitals  Enc Vitals Group     BP 06/16/20 1425 (!) 147/85     Pulse Rate 06/16/20 1425 76     Resp 06/16/20 1425 16  Temp 06/16/20 1425 98.9 F (37.2 C)     Temp Source 06/16/20 1425 Oral     SpO2 06/16/20 1425 99 %     Weight 06/16/20 1426 213 lb (96.6 kg)     Height 06/16/20 1426 $RemoveBefor'5\' 2"'[id number]$  (1.575 m)     Head Circumference --      Peak Flow --      Pain Score 06/16/20 1426 3     Pain Loc --      Pain Edu? --      Excl. in [place]? --    Vitals:   06/16/20 1900 06/16/20 1945  BP: (!) 142/90 119/87  Pulse: 77 67  Resp: 16   Temp: 98.9 F  (37.2 C)   SpO2: 100% 100%   Physical Exam Vitals and nursing note reviewed.  Constitutional:      General: She is not in acute distress.    Appearance: She is well-developed.  HENT:     Head: Normocephalic and atraumatic.  Eyes:     Conjunctiva/sclera: Conjunctivae normal.  Cardiovascular:     Rate and Rhythm: Normal rate and regular rhythm.     Heart sounds: No murmur heard.   Pulmonary:     Effort: Pulmonary effort is normal. No respiratory distress.     Breath sounds: Normal breath sounds.  Abdominal:     Palpations: Abdomen is soft.     Tenderness: There is abdominal tenderness in the right upper quadrant. There is no right CVA tenderness or left CVA tenderness.  Musculoskeletal:     Cervical back: Neck supple.  Skin:    General: Skin is warm and dry.     Capillary Refill: Capillary refill takes less than 2 seconds.  Neurological:     General: No focal deficit present.     Mental Status: She is alert.  Psychiatric:        Mood and Affect: Mood normal.      ____________________________________________   LABS (all labs ordered are listed, but only abnormal results are displayed)  Labs Reviewed  COMPREHENSIVE METABOLIC PANEL - Abnormal; Notable for the following components:      Result Value   Glucose, Bld 114 (*)    BUN <5 (*)    Calcium 8.8 (*)    AST 217 (*)    ALT 141 (*)    Total Bilirubin 1.9 (*)    All other components within normal limits  CBC - Abnormal; Notable for the following components:   MCV 77.6 (*)    MCH 24.8 (*)    All other components within normal limits  URINALYSIS, COMPLETE (UACMP) WITH MICROSCOPIC - Abnormal; Notable for the following components:   Color, Urine AMBER (*)    APPearance CLEAR (*)    All other components within normal limits  RESPIRATORY PANEL BY RT PCR (FLU A&B, COVID)  LIPASE, BLOOD  POC URINE PREG, ED    ____________________________________________   ____________________________________________  RADIOLOGY   Official radiology report(s): US ABDOMEN LIMITED RUQ (LIVER/GB)  Result Date: 06/16/2020 CLINICAL DATA:  Right upper quadrant pain for 1 month with elevated LFTs EXAM: ULTRASOUND ABDOMEN LIMITED RIGHT UPPER QUADRANT COMPARISON:  None. FINDINGS: Gallbladder: Gallbladder is partially distended. Multiple small echogenicities are noted consistent with gallstones. No wall thickening or pericholecystic fluid is noted. Negative sonographic Murphy's sign is elicited. Common bile duct: Diameter: 3.8 mm. Liver: No focal lesion identified. Within normal limits in parenchymal echogenicity. Portal vein is patent on color Doppler imaging with normal direction of blood  flow towards the liver. Other: None. IMPRESSION: Multiple small gallstones. Partial distension of the gallbladder consistent with the postprandial state. No other focal abnormality is noted. Electronically Signed   By: Inez Catalina M.D.   On: 06/16/2020 19:12    ____________________________________________   PROCEDURES  Procedure(s) performed (including Critical Care):  Procedures   ____________________________________________   INITIAL IMPRESSION / ASSESSMENT AND PLAN / ED COURSE        Patient presents above to history exam for assessment of right upper quadrant pain that has become more frequent and was very severe last night and today.  Is also states he was in a blood nonbilious emesis today.  Patient is hypertensive with a BP of 147/85 otherwise stable vital signs on room air.  Differential includes but is not limited to acute cholecystitis, choledocholithiasis, kidney stone, pancreatitis, hepatitis, gastritis, cystitis, pyelonephritis.  No tenderness in the right lower quadrant or left lower quadrant or other lower abdominal or pelvic findings on history exam to suggest appendicitis diverticulitis or GU  pathology.  No CVA tenderness fever or elevation white blood cell count or findings on UA to suggest infected urine or pyelonephritis.  CMP remarkable for an AST of 217, ALT of 141, and T bili of 1.9 with an alk phos of 125.  No other significant lateral or metabolic derangements.  CBC unremarkable.  Urine pregnancy test is negative.  Lipase is 33 not consistent with pancreatitis.  Ultrasound obtained does not show evidence of acute cholecystitis or choledocholithiasis shows a common bile duct is within normal notes.  Discussed patient's presentation and work-up with on-call gastroenterologist Dr. Vicente Males who recommended obtaining an MRI to see if there was an occult stone in the patient's bile duct versus if not findings likely related to passed stone.  Care of patient signed over to oncoming provider at approximately 8 PM.  Plan is to follow-up MRCP and if unremarkable discharge with GI follow-up.  If there is a retained stone patient will likely require ERCP.  Patient will require transfer is not currently an on-call provider and that performs ERCP.    ____________________________________________   FINAL CLINICAL IMPRESSION(S) / ED DIAGNOSES  Final diagnoses:  RUQ pain  Gallstones  Transaminitis  Bilirubinemia    Medications  lactated ringers bolus 1,000 mL (1,000 mLs Intravenous New Bag/Given 06/16/20 1938)  ondansetron (ZOFRAN) injection 4 mg (4 mg Intravenous Given 06/16/20 1939)  morphine 4 MG/ML injection 4 mg (4 mg Intravenous Given 06/16/20 1939)     ED Discharge Orders    None       Note:  This document was prepared using Dragon voice recognition software and may include unintentional dictation errors.   Lucrezia Starch, MD 06/16/20 2004

## 2020-06-16 NOTE — ED Provider Notes (Signed)
Procedures     ----------------------------------------- 11:27 PM on 06/16/2020 -----------------------------------------   MRCP negative for choledocholithiasis.  No signs of cholecystitis.  Vital signs remain normal, patient reports feeling well and pain-free.  Tolerating p.o., stable for discharge home on Toradol and Zofran as needed, follow-up in surgery clinic, return precautions given.   Sharman Cheek, MD 06/16/20 2328

## 2020-06-22 ENCOUNTER — Ambulatory Visit (INDEPENDENT_AMBULATORY_CARE_PROVIDER_SITE_OTHER): Payer: PRIVATE HEALTH INSURANCE | Admitting: Surgery

## 2020-06-22 ENCOUNTER — Encounter: Payer: Self-pay | Admitting: Surgery

## 2020-06-22 ENCOUNTER — Other Ambulatory Visit: Payer: Self-pay

## 2020-06-22 VITALS — BP 136/86 | HR 88 | Temp 98.3°F | Resp 12 | Ht 62.0 in | Wt 208.0 lb

## 2020-06-22 DIAGNOSIS — K8020 Calculus of gallbladder without cholecystitis without obstruction: Secondary | ICD-10-CM | POA: Diagnosis not present

## 2020-06-22 NOTE — H&P (View-Only) (Signed)
06/22/2020  Reason for Visit: Symptomatic cholelithiasis  History of Present Illness: Donna Buckley is a 19 y.o. female presenting for follow-up of symptomatic cholelithiasis.  The patient went to the emergency room on 11/2 with right upper quadrant pain, nausea, and vomiting.  She reports having had previous milder episodes since her C-section 2 months ago.  During her visit to the emergency department, her laboratory work-up revealed mildly elevated LFTs with total bilirubin of 1.9, AST 217, ALP 141, and alkaline phosphatase 125.  She had an ultrasound which showed cholelithiasis with multiple small stones and a normal CBD diameter.  As a precaution she had an MRCP which was negative for choledocholithiasis.  She reports that since her visit from the emergency room, she has had 1 more minor episode.  She does try to stay away from greasy and fatty foods.  Denies any fevers, chills, chest pain, shortness of breath.  During her pregnancy, she did not have any issues with abdominal pain but she has since her C-section.  Overall, she has had 5 episodes in the past 2 months.  Past Medical History: Past Medical History:  Diagnosis Date  .  Symptomatic cholelithiasis Cholestasis during pregnancy      Past Surgical History: Past Surgical History:  Procedure Laterality Date  . CESAREAN SECTION N/A 04/19/2020   Procedure: CESAREAN SECTION;  Surgeon: Vena Austria, MD;  Location: [facility] ORS;  Service: Obstetrics;  Laterality: N/A;  . NO PAST SURGERIES      Home Medications: Prior to Admission medications   Medication Sig Start Date End Date Taking? Authorizing Provider  JUNEL 1/20 1-20 MG-MCG tablet Take 1 tablet by mouth daily. 06/04/20  Yes [provider]    Allergies: No Known Allergies  Social History:  reports that she has never smoked. She has never used smokeless tobacco. She reports previous alcohol use. She reports that she does not use drugs.   Family History: Family  History  Problem Relation Age of Onset  . Healthy Mother   . Healthy Father     Review of Systems: Review of Systems  Constitutional: Negative for chills and fever.  HENT: Negative for hearing loss.   Respiratory: Negative for shortness of breath.   Cardiovascular: Negative for chest pain.  Gastrointestinal: Positive for abdominal pain, nausea and vomiting. Negative for constipation and diarrhea.  Genitourinary: Negative for dysuria.  Musculoskeletal: Negative for myalgias.  Skin: Negative for rash.  Neurological: Negative for dizziness.  Psychiatric/Behavioral: Negative for depression.    Physical Exam BP 136/86   Pulse 88   Temp 98.3 F (36.8 C) (Oral)   Resp 12   Ht 5\' 2"  (1.575 m)   Wt 208 lb (94.3 kg)   LMP 05/28/2020   SpO2 98%   BMI 38.04 kg/m  CONSTITUTIONAL: No acute distress HEENT:  Normocephalic, atraumatic, extraocular motion intact. NECK: Trachea is midline, and there is no jugular venous distension.  RESPIRATORY:  Normal respiratory effort without pathologic use of accessory muscles. CARDIOVASCULAR: Regular rhythm and rate. GI: The abdomen is soft, obese, nondistended, currently nontender to palpation.  Negative Murphy's sign.  MUSCULOSKELETAL:  Normal muscle strength and tone in all four extremities.  No peripheral edema or cyanosis. SKIN: Skin turgor is normal. There are no pathologic skin lesions.  NEUROLOGIC:  Motor and sensation is grossly normal.  Cranial nerves are grossly intact. PSYCH:  Alert and oriented to person, place and time. Affect is normal.  Laboratory Analysis: Labs from 06/16/2020: Sodium 138, potassium 3.8, chloride 102, CO2  25, BUN less than 5, creatinine 0.79.  Total bilirubin 1.9, AST 217, ALT 121, alkaline phosphatase 125, lipase 33.  WBC 4.3, hemoglobin 12, hematocrit 37.5, platelet 382.  Imaging: Ultrasound 06/16/2020: IMPRESSION: Multiple small gallstones.  Partial distension of the gallbladder consistent with  the postprandial state.  No other focal abnormality is noted.  MRCP abdomen 06/16/2020: IMPRESSION: Cholelithiasis without superimposed inflammatory change. Otherwise unremarkable examination.   Assessment and Plan: This is a 19 y.o. female with symptomatic cholelithiasis.  -Discussed with the patient.  Reason behind obtaining an MRCP following her initial ultrasound due to her elevated LFTs.  Most likely she probably had a small stone that passed through the common bile duct into her intestine.  At this point in time, she is pain-free but she is very much interested in having surgery due to the frequency of her symptoms.  I would agree with her particularly given that she did probably have choledocholithiasis which resolved by the time her MRCP was done.  Discussed with her the role for minimally invasive cholecystectomy particularly robotic cholecystectomy.  Discussed with her the risks of bleeding, infection, injury to surrounding structures.  Discussed with her postoperative course and activity restrictions.  She is willing to proceed. -Patient will be scheduled for 07/02/2020.  She understands that she will need a COVID-19 test prior to surgery.  We will also obtain a new set of labs particularly LFTs to make sure these have resolved.  Face-to-face time spent with the patient and care providers was 45 minutes, with more than 50% of the time spent counseling, educating, and coordinating care of the patient.     Dawson Albers Luis Ardell Makarewicz, MD [facility]   

## 2020-06-22 NOTE — Patient Instructions (Addendum)
Our surgery scheduler will call you within 24-48 hours to schedule your surgery. Please have the Blue surgery sheet available when speaking with her.   Cholelithiasis  Cholelithiasis is also called "gallstones." It is a kind of gallbladder disease. The gallbladder is an organ that stores a liquid (bile) that helps you digest fat. Gallstones may not cause symptoms (may be silent gallstones) until they cause a blockage, and then they can cause pain (gallbladder attack). Follow these instructions at home:  Take over-the-counter and prescription medicines only as told by your doctor.  Stay at a healthy weight.  Eat healthy foods. This includes: ? Eating fewer fatty foods, like fried foods. ? Eating fewer refined carbs (refined carbohydrates). Refined carbs are breads and grains that are highly processed, like white bread and white rice. Instead, choose whole grains like whole-wheat bread and brown rice. ? Eating more fiber. Almonds, fresh fruit, and beans are healthy sources of fiber.  Keep all follow-up visits as told by your doctor. This is important. Contact a doctor if:  You have sudden pain in the upper right side of your belly (abdomen). Pain might spread to your right shoulder or your chest. This may be a sign of a gallbladder attack.  You feel sick to your stomach (are nauseous).  You throw up (vomit).  You have been diagnosed with gallstones that have no symptoms and you get: ? Belly pain. ? Discomfort, burning, or fullness in the upper part of your belly (indigestion). Get help right away if:  You have sudden pain in the upper right side of your belly, and it lasts for more than 2 hours.  You have belly pain that lasts for more than 5 hours.  You have a fever or chills.  You keep feeling sick to your stomach or you keep throwing up.  Your skin or the whites of your eyes turn yellow (jaundice).  You have dark-colored pee (urine).  You have light-colored poop  (stool). Summary  Cholelithiasis is also called "gallstones."  The gallbladder is an organ that stores a liquid (bile) that helps you digest fat.  Silent gallstones are gallstones that do not cause symptoms.  A gallbladder attack may cause sudden pain in the upper right side of your belly. Pain might spread to your right shoulder or your chest. If this happens, contact your doctor.  If you have sudden pain in the upper right side of your belly that lasts for more than 2 hours, get help right away. This information is not intended to replace advice given to you by your health care provider. Make sure you discuss any questions you have with your health care provider.  Gallbladder Eating Plan If you have a gallbladder condition, you may have trouble digesting fats. Eating a low-fat diet can help reduce your symptoms, and may be helpful before and after having surgery to remove your gallbladder (cholecystectomy). Your health care provider may recommend that you work with a diet and nutrition specialist (dietitian) to help you reduce the amount of fat in your diet. What are tips for following this plan? General guidelines  Limit your fat intake to less than 30% of your total daily calories. If you eat around 1,800 calories each day, this is less than 60 grams (g) of fat per day.  Fat is an important part of a healthy diet. Eating a low-fat diet can make it hard to maintain a healthy body weight. Ask your dietitian how much fat, calories, and other nutrients you  need each day.  Eat small, frequent meals throughout the day instead of three large meals.  Drink at least 8-10 cups of fluid a day. Drink enough fluid to keep your urine clear or pale yellow.  Limit alcohol intake to no more than 1 drink a day for nonpregnant women and 2 drinks a day for men. One drink equals 12 oz of beer, 5 oz of wine, or 1 oz of hard liquor. Reading food labels  Check Nutrition Facts on food labels for the amount  of fat per serving. Choose foods with less than 3 grams of fat per serving. Shopping  Choose nonfat and low-fat healthy foods. Look for the words "nonfat," "low fat," or "fat free."  Avoid buying processed or prepackaged foods. Cooking  Cook using low-fat methods, such as baking, broiling, grilling, or boiling.  Cook with small amounts of healthy fats, such as olive oil, grapeseed oil, canola oil, or sunflower oil. What foods are recommended?   All fresh, frozen, or canned fruits and vegetables.  Whole grains.  Low-fat or non-fat (skim) milk and yogurt.  Lean meat, skinless poultry, fish, eggs, and beans.  Low-fat protein supplement powders or drinks.  Spices and herbs. What foods are not recommended?  High-fat foods. These include baked goods, fast food, fatty cuts of meat, ice cream, french toast, sweet rolls, pizza, cheese bread, foods covered with butter, creamy sauces, or cheese.  Fried foods. These include french fries, tempura, battered fish, breaded chicken, fried breads, and sweets.  Foods with strong odors.  Foods that cause bloating and gas. Summary  A low-fat diet can be helpful if you have a gallbladder condition, or before and after gallbladder surgery.  Limit your fat intake to less than 30% of your total daily calories. This is about 60 g of fat if you eat 1,800 calories each day.  Eat small, frequent meals throughout the day instead of three large meals. This information is not intended to replace advice given to you by your health care provider. Make sure you discuss any questions you have with your health care provider. Document Revised: 11/22/2018 Document Reviewed: 09/08/2016 Elsevier Patient Education  2020 Elsevier Inc. Document Revised: 07/14/2017 Document Reviewed: 04/17/2016 Elsevier Patient Education  2020 [employer].

## 2020-06-22 NOTE — Progress Notes (Signed)
06/22/2020  Reason for Visit: Symptomatic cholelithiasis  History of Present Illness: Donna Buckley is a 19 y.o. female presenting for follow-up of symptomatic cholelithiasis.  The patient went to the emergency room on 11/2 with right upper quadrant pain, nausea, and vomiting.  She reports having had previous milder episodes since her C-section 2 months ago.  During her visit to the emergency department, her laboratory work-up revealed mildly elevated LFTs with total bilirubin of 1.9, AST 217, ALP 141, and alkaline phosphatase 125.  She had an ultrasound which showed cholelithiasis with multiple small stones and a normal CBD diameter.  As a precaution she had an MRCP which was negative for choledocholithiasis.  She reports that since her visit from the emergency room, she has had 1 more minor episode.  She does try to stay away from greasy and fatty foods.  Denies any fevers, chills, chest pain, shortness of breath.  During her pregnancy, she did not have any issues with abdominal pain but she has since her C-section.  Overall, she has had 5 episodes in the past 2 months.  Past Medical History: Past Medical History:  Diagnosis Date  .  Symptomatic cholelithiasis Cholestasis during pregnancy      Past Surgical History: Past Surgical History:  Procedure Laterality Date  . CESAREAN SECTION N/A 04/19/2020   Procedure: CESAREAN SECTION;  Surgeon: Vena Austria, MD;  Location: [facility] ORS;  Service: Obstetrics;  Laterality: N/A;  . NO PAST SURGERIES      Home Medications: Prior to Admission medications   Medication Sig Start Date End Date Taking? Authorizing Provider  JUNEL 1/20 1-20 MG-MCG tablet Take 1 tablet by mouth daily. 06/04/20  Yes [provider]    Allergies: No Known Allergies  Social History:  reports that she has never smoked. She has never used smokeless tobacco. She reports previous alcohol use. She reports that she does not use drugs.   Family History: Family  History  Problem Relation Age of Onset  . Healthy Mother   . Healthy Father     Review of Systems: Review of Systems  Constitutional: Negative for chills and fever.  HENT: Negative for hearing loss.   Respiratory: Negative for shortness of breath.   Cardiovascular: Negative for chest pain.  Gastrointestinal: Positive for abdominal pain, nausea and vomiting. Negative for constipation and diarrhea.  Genitourinary: Negative for dysuria.  Musculoskeletal: Negative for myalgias.  Skin: Negative for rash.  Neurological: Negative for dizziness.  Psychiatric/Behavioral: Negative for depression.    Physical Exam BP 136/86   Pulse 88   Temp 98.3 F (36.8 C) (Oral)   Resp 12   Ht 5\' 2"  (1.575 m)   Wt 208 lb (94.3 kg)   LMP 05/28/2020   SpO2 98%   BMI 38.04 kg/m  CONSTITUTIONAL: No acute distress HEENT:  Normocephalic, atraumatic, extraocular motion intact. NECK: Trachea is midline, and there is no jugular venous distension.  RESPIRATORY:  Normal respiratory effort without pathologic use of accessory muscles. CARDIOVASCULAR: Regular rhythm and rate. GI: The abdomen is soft, obese, nondistended, currently nontender to palpation.  Negative Murphy's sign.  MUSCULOSKELETAL:  Normal muscle strength and tone in all four extremities.  No peripheral edema or cyanosis. SKIN: Skin turgor is normal. There are no pathologic skin lesions.  NEUROLOGIC:  Motor and sensation is grossly normal.  Cranial nerves are grossly intact. PSYCH:  Alert and oriented to person, place and time. Affect is normal.  Laboratory Analysis: Labs from 06/16/2020: Sodium 138, potassium 3.8, chloride 102, CO2  25, BUN less than 5, creatinine 0.79.  Total bilirubin 1.9, AST 217, ALT 121, alkaline phosphatase 125, lipase 33.  WBC 4.3, hemoglobin 12, hematocrit 37.5, platelet 382.  Imaging: Ultrasound 06/16/2020: IMPRESSION: Multiple small gallstones.  Partial distension of the gallbladder consistent with  the postprandial state.  No other focal abnormality is noted.  MRCP abdomen 06/16/2020: IMPRESSION: Cholelithiasis without superimposed inflammatory change. Otherwise unremarkable examination.   Assessment and Plan: This is a 19 y.o. female with symptomatic cholelithiasis.  -Discussed with the patient.  Reason behind obtaining an MRCP following her initial ultrasound due to her elevated LFTs.  Most likely she probably had a small stone that passed through the common bile duct into her intestine.  At this point in time, she is pain-free but she is very much interested in having surgery due to the frequency of her symptoms.  I would agree with her particularly given that she did probably have choledocholithiasis which resolved by the time her MRCP was done.  Discussed with her the role for minimally invasive cholecystectomy particularly robotic cholecystectomy.  Discussed with her the risks of bleeding, infection, injury to surrounding structures.  Discussed with her postoperative course and activity restrictions.  She is willing to proceed. -Patient will be scheduled for 07/02/2020.  She understands that she will need a COVID-19 test prior to surgery.  We will also obtain a new set of labs particularly LFTs to make sure these have resolved.  Face-to-face time spent with the patient and care providers was 45 minutes, with more than 50% of the time spent counseling, educating, and coordinating care of the patient.     Howie Ill, MD [facility]

## 2020-06-23 ENCOUNTER — Telehealth: Payer: Self-pay | Admitting: Surgery

## 2020-06-23 NOTE — Telephone Encounter (Signed)
Patient has been advised of Pre-Admission date/time, COVID Testing date and Surgery date.  Surgery Date: 07/02/20 Preadmission Testing Date: 06/26/20 (phone 1p-5p) Covid Testing Date: 06/30/20 - patient advised to [facility] ([address] [facility]) between 8a-1p   Patient has been made aware to call [phone], between 1-3:00pm the day before surgery, to find out what time to arrive for surgery.

## 2020-06-26 ENCOUNTER — Encounter
Admission: RE | Admit: 2020-06-26 | Discharge: 2020-06-26 | Disposition: A | Discharge status: Home / Self Care | Payer: PRIVATE HEALTH INSURANCE | Source: Ambulatory Visit | Attending: Surgery | Admitting: Surgery

## 2020-06-26 ENCOUNTER — Other Ambulatory Visit: Payer: Self-pay

## 2020-06-26 NOTE — Patient Instructions (Signed)
Your procedure is scheduled on: 07/02/20 Report to DAY SURGERY DEPARTMENT LOCATED ON 2ND FLOOR MEDICAL MALL ENTRANCE. To find out your arrival time please call [phone] between 1PM - 3PM on 07/01/20.  Remember: Instructions that are not followed completely may result in serious medical risk, up to and including death, or upon the discretion of your surgeon and anesthesiologist your surgery may need to be rescheduled.     _X__ 1. Do not eat food after midnight the night before your procedure.                 No gum chewing or hard candies. You may drink clear liquids up to 2 hours                 before you are scheduled to arrive for your surgery- DO not drink clear                 liquids within 2 hours of the start of your surgery.                 Clear Liquids include:  water, apple juice without pulp, clear carbohydrate                 drink such as Clearfast or Gatorade, Black Coffee or Tea (Do not add                 anything to coffee or tea). Diabetics water only  __X__2.  On the morning of surgery brush your teeth with toothpaste and water, you                 may rinse your mouth with mouthwash if you wish.  Do not swallow any              toothpaste of mouthwash.     _X__ 3.  No Alcohol for 24 hours before or after surgery.   _X__ 4.  Do Not Smoke or use e-cigarettes For 24 Hours Prior to Your Surgery.                 Do not use any chewable tobacco products for at least 6 hours prior to                 surgery.  ____  5.  Bring all medications with you on the day of surgery if instructed.   __X__  6.  Notify your doctor if there is any change in your medical condition      (cold, fever, infections).     Do not wear jewelry, make-up, hairpins, clips or nail polish. Do not wear lotions, powders, or perfumes.  Do not shave 48 hours prior to surgery. Men may shave face and neck. Do not bring valuables to the hospital.    [facility] is not responsible for any belongings  or valuables.  Contacts, dentures/partials or body piercings may not be worn into surgery. Bring a case for your contacts, glasses or hearing aids, a denture cup will be supplied. Leave your suitcase in the car. After surgery it may be brought to your room. For patients admitted to the hospital, discharge time is determined by your treatment team.   Patients discharged the day of surgery will not be allowed to drive home.   Please read over the following fact sheets that you were given:   MRSA Information  __X__ Take these medicines the morning of surgery with A SIP OF WATER:  1.   2.   3.   4.  5.  6.  ____ Fleet Enema (as directed)   __X__ Use CHG Soap/SAGE wipes as directed  ____ Use inhalers on the day of surgery  ____ Stop metformin/Janumet/Farxiga 2 days prior to surgery    ____ Take 1/2 of usual insulin dose the night before surgery. No insulin the morning          of surgery.   ____ Stop Blood Thinners Coumadin/Plavix/Xarelto/Pleta/Pradaxa/Eliquis/Effient/Aspirin  on   Or contact your Surgeon, Cardiologist or Medical Doctor regarding  ability to stop your blood thinners  __X__ Stop Anti-inflammatories 7 days before surgery such as Advil, Ibuprofen, Motrin,  BC or Goodies Powder, Naprosyn, Naproxen, Aleve, Aspirin   MAY TAKE TYLENOL IF NEEDED  __X__ Stop all herbal supplements, fish oil or vitamin E until after surgery.    ____ Bring C-Pap to the hospital.

## 2020-06-30 ENCOUNTER — Other Ambulatory Visit
Admission: RE | Admit: 2020-06-30 | Discharge: 2020-06-30 | Disposition: A | Discharge status: Home / Self Care | Payer: PRIVATE HEALTH INSURANCE | Source: Ambulatory Visit | Attending: Surgery | Admitting: Surgery

## 2020-06-30 ENCOUNTER — Other Ambulatory Visit: Payer: Self-pay

## 2020-06-30 DIAGNOSIS — Z01812 Encounter for preprocedural laboratory examination: Secondary | ICD-10-CM | POA: Insufficient documentation

## 2020-06-30 DIAGNOSIS — Z20822 Contact with and (suspected) exposure to covid-19: Secondary | ICD-10-CM | POA: Diagnosis not present

## 2020-06-30 LAB — CBC WITH DIFFERENTIAL/PLATELET
Abs Immature Granulocytes: 0.01 10*3/uL (ref 0.00–0.07)
Basophils Absolute: 0 10*3/uL (ref 0.0–0.1)
Basophils Relative: 1 %
Eosinophils Absolute: 0.1 10*3/uL (ref 0.0–0.5)
Eosinophils Relative: 1 %
HCT: 40.3 % (ref 36.0–46.0)
Hemoglobin: 12.4 g/dL (ref 12.0–15.0)
Immature Granulocytes: 0 %
Lymphocytes Relative: 45 %
Lymphs Abs: 1.6 10*3/uL (ref 0.7–4.0)
MCH: 24.6 pg — ABNORMAL LOW (ref 26.0–34.0)
MCHC: 30.8 g/dL (ref 30.0–36.0)
MCV: 79.8 fL — ABNORMAL LOW (ref 80.0–100.0)
Monocytes Absolute: 0.4 10*3/uL (ref 0.1–1.0)
Monocytes Relative: 10 %
Neutro Abs: 1.6 10*3/uL — ABNORMAL LOW (ref 1.7–7.7)
Neutrophils Relative %: 43 %
Platelets: 437 10*3/uL — ABNORMAL HIGH (ref 150–400)
RBC: 5.05 MIL/uL (ref 3.87–5.11)
RDW: 15.1 % (ref 11.5–15.5)
WBC: 3.6 10*3/uL — ABNORMAL LOW (ref 4.0–10.5)
nRBC: 0 % (ref 0.0–0.2)

## 2020-06-30 LAB — COMPREHENSIVE METABOLIC PANEL
ALT: 41 U/L (ref 0–44)
AST: 27 U/L (ref 15–41)
Albumin: 3.5 g/dL (ref 3.5–5.0)
Alkaline Phosphatase: 89 U/L (ref 38–126)
Anion gap: 9 (ref 5–15)
BUN: 5 mg/dL — ABNORMAL LOW (ref 6–20)
CO2: 26 mmol/L (ref 22–32)
Calcium: 9 mg/dL (ref 8.9–10.3)
Chloride: 104 mmol/L (ref 98–111)
Creatinine, Ser: 0.79 mg/dL (ref 0.44–1.00)
GFR, Estimated: >60 mL/min (ref >60)
Glucose, Bld: 85 mg/dL (ref 70–99)
Potassium: 3.3 mmol/L — ABNORMAL LOW (ref 3.5–5.1)
Sodium: 139 mmol/L (ref 135–145)
Total Bilirubin: 0.6 mg/dL (ref 0.3–1.2)
Total Protein: 7.7 g/dL (ref 6.5–8.1)

## 2020-07-01 LAB — SARS CORONAVIRUS 2 (TAT 6-24 HRS): SARS Coronavirus 2: NEGATIVE

## 2020-07-01 MED ORDER — CEFAZOLIN SODIUM-DEXTROSE 2-4 GM/100ML-% IV SOLN
2.0000 g | INTRAVENOUS | Status: AC
  Administered 2020-07-02: 2 g via INTRAVENOUS

## 2020-07-01 MED ORDER — GABAPENTIN 300 MG PO CAPS
300.0000 mg | ORAL_CAPSULE | ORAL | Status: AC
  Administered 2020-07-02: 300 mg via ORAL

## 2020-07-01 MED ORDER — FAMOTIDINE 20 MG PO TABS
20.0000 mg | ORAL_TABLET | Freq: Once | ORAL | Status: AC
  Administered 2020-07-02: 20 mg via ORAL

## 2020-07-01 MED ORDER — LACTATED RINGERS IV SOLN: INTRAVENOUS | Status: DC

## 2020-07-01 MED ORDER — ORAL CARE MOUTH RINSE: 15.0000 mL | Freq: Once | OROMUCOSAL | Status: AC

## 2020-07-01 MED ORDER — CHLORHEXIDINE GLUCONATE 0.12 % MT SOLN
15.0000 mL | Freq: Once | OROMUCOSAL | Status: AC
  Administered 2020-07-02: 15 mL via OROMUCOSAL

## 2020-07-01 MED ORDER — ACETAMINOPHEN 500 MG PO TABS
1000.0000 mg | ORAL_TABLET | ORAL | Status: AC
  Administered 2020-07-02: 1000 mg via ORAL

## 2020-07-01 MED ORDER — INDOCYANINE GREEN 25 MG IV SOLR
2.5000 mg | INTRAVENOUS | Status: AC
  Administered 2020-07-02: 2.5 mg via INTRAVENOUS
  Filled 2020-07-01: qty 1

## 2020-07-02 ENCOUNTER — Encounter
Admission: RE | Disposition: A | Discharge status: Home / Self Care | Payer: Self-pay | Source: Home / Self Care | Attending: Surgery

## 2020-07-02 ENCOUNTER — Encounter: Payer: Self-pay | Admitting: Surgery

## 2020-07-02 ENCOUNTER — Ambulatory Visit: Payer: PRIVATE HEALTH INSURANCE | Admitting: Certified Registered"

## 2020-07-02 ENCOUNTER — Other Ambulatory Visit: Payer: Self-pay

## 2020-07-02 ENCOUNTER — Ambulatory Visit
Admission: RE | Admit: 2020-07-02 | Discharge: 2020-07-02 | Disposition: A | Discharge status: Home / Self Care | Payer: PRIVATE HEALTH INSURANCE | Attending: Surgery | Admitting: Surgery

## 2020-07-02 DIAGNOSIS — K8020 Calculus of gallbladder without cholecystitis without obstruction: Secondary | ICD-10-CM | POA: Diagnosis not present

## 2020-07-02 DIAGNOSIS — K8010 Calculus of gallbladder with chronic cholecystitis without obstruction: Secondary | ICD-10-CM | POA: Diagnosis present

## 2020-07-02 LAB — POCT PREGNANCY, URINE: Preg Test, Ur: NEGATIVE

## 2020-07-02 SURGERY — CHOLECYSTECTOMY, ROBOT-ASSISTED, LAPAROSCOPIC
Anesthesia: General

## 2020-07-02 MED ORDER — MIDAZOLAM HCL 2 MG/2ML IJ SOLN
INTRAMUSCULAR | Status: DC | PRN
  Administered 2020-07-02: 2 mg via INTRAVENOUS

## 2020-07-02 MED ORDER — ROCURONIUM BROMIDE 100 MG/10ML IV SOLN
INTRAVENOUS | Status: DC | PRN
  Administered 2020-07-02: 10 mg via INTRAVENOUS
  Administered 2020-07-02: 50 mg via INTRAVENOUS

## 2020-07-02 MED ORDER — DROPERIDOL 2.5 MG/ML IJ SOLN
0.6250 mg | Freq: Once | INTRAMUSCULAR | Status: DC | PRN
  Filled 2020-07-02: qty 2

## 2020-07-02 MED ORDER — SUCCINYLCHOLINE CHLORIDE 200 MG/10ML IV SOSY
PREFILLED_SYRINGE | INTRAVENOUS | Status: AC
  Filled 2020-07-02: qty 10

## 2020-07-02 MED ORDER — LIDOCAINE HCL (PF) 2 % IJ SOLN
INTRAMUSCULAR | Status: AC
  Filled 2020-07-02: qty 5

## 2020-07-02 MED ORDER — MIDAZOLAM HCL 2 MG/2ML IJ SOLN
INTRAMUSCULAR | Status: AC
  Filled 2020-07-02: qty 2

## 2020-07-02 MED ORDER — ACETAMINOPHEN 325 MG PO TABS: 325.0000 mg | ORAL_TABLET | ORAL | Status: DC | PRN

## 2020-07-02 MED ORDER — FENTANYL CITRATE (PF) 100 MCG/2ML IJ SOLN
INTRAMUSCULAR | Status: AC
  Filled 2020-07-02: qty 2

## 2020-07-02 MED ORDER — CHLORHEXIDINE GLUCONATE CLOTH 2 % EX PADS: 6.0000 | MEDICATED_PAD | Freq: Once | CUTANEOUS | Status: DC

## 2020-07-02 MED ORDER — GABAPENTIN 300 MG PO CAPS
ORAL_CAPSULE | ORAL | Status: AC
  Filled 2020-07-02: qty 1

## 2020-07-02 MED ORDER — ACETAMINOPHEN 500 MG PO TABS
ORAL_TABLET | ORAL | Status: AC
  Filled 2020-07-02: qty 2

## 2020-07-02 MED ORDER — LIDOCAINE HCL (CARDIAC) PF 100 MG/5ML IV SOSY
PREFILLED_SYRINGE | INTRAVENOUS | Status: DC | PRN
  Administered 2020-07-02: 100 mg via INTRAVENOUS

## 2020-07-02 MED ORDER — DEXAMETHASONE SODIUM PHOSPHATE 10 MG/ML IJ SOLN
INTRAMUSCULAR | Status: AC
  Filled 2020-07-02: qty 1

## 2020-07-02 MED ORDER — KETOROLAC TROMETHAMINE 30 MG/ML IJ SOLN
INTRAMUSCULAR | Status: DC | PRN
  Administered 2020-07-02: 30 mg via INTRAVENOUS

## 2020-07-02 MED ORDER — GLYCOPYRROLATE 0.2 MG/ML IJ SOLN
INTRAMUSCULAR | Status: AC
  Filled 2020-07-02: qty 1

## 2020-07-02 MED ORDER — PROMETHAZINE HCL 25 MG/ML IJ SOLN: 6.2500 mg | INTRAMUSCULAR | Status: DC | PRN

## 2020-07-02 MED ORDER — HYDROCODONE-ACETAMINOPHEN 7.5-325 MG PO TABS
ORAL_TABLET | ORAL | Status: AC
  Filled 2020-07-02: qty 1

## 2020-07-02 MED ORDER — FENTANYL CITRATE (PF) 100 MCG/2ML IJ SOLN: 25.0000 ug | INTRAMUSCULAR | Status: DC | PRN

## 2020-07-02 MED ORDER — SUGAMMADEX SODIUM 500 MG/5ML IV SOLN
INTRAVENOUS | Status: AC
  Filled 2020-07-02: qty 5

## 2020-07-02 MED ORDER — DEXMEDETOMIDINE (PRECEDEX) IN NS 20 MCG/5ML (4 MCG/ML) IV SYRINGE
PREFILLED_SYRINGE | INTRAVENOUS | Status: DC | PRN
  Administered 2020-07-02: 12 ug via INTRAVENOUS
  Administered 2020-07-02: 4 ug via INTRAVENOUS

## 2020-07-02 MED ORDER — HYDROCODONE-ACETAMINOPHEN 7.5-325 MG PO TABS
1.0000 | ORAL_TABLET | Freq: Once | ORAL | Status: AC | PRN
  Administered 2020-07-02: 1 via ORAL

## 2020-07-02 MED ORDER — ACETAMINOPHEN 160 MG/5ML PO SOLN
325.0000 mg | ORAL | Status: DC | PRN
  Filled 2020-07-02: qty 20.3

## 2020-07-02 MED ORDER — CHLORHEXIDINE GLUCONATE 0.12 % MT SOLN
OROMUCOSAL | Status: AC
  Filled 2020-07-02: qty 15

## 2020-07-02 MED ORDER — GLYCOPYRROLATE 0.2 MG/ML IJ SOLN
INTRAMUSCULAR | Status: DC | PRN
  Administered 2020-07-02: .2 mg via INTRAVENOUS

## 2020-07-02 MED ORDER — FAMOTIDINE 20 MG PO TABS
ORAL_TABLET | ORAL | Status: AC
  Filled 2020-07-02: qty 1

## 2020-07-02 MED ORDER — IBUPROFEN 600 MG PO TABS: 600.0000 mg | ORAL_TABLET | Freq: Three times a day (TID) | ORAL | 1 refills | Status: DC | PRN

## 2020-07-02 MED ORDER — MEPERIDINE HCL 50 MG/ML IJ SOLN: 6.2500 mg | INTRAMUSCULAR | Status: DC | PRN

## 2020-07-02 MED ORDER — CEFAZOLIN SODIUM-DEXTROSE 2-4 GM/100ML-% IV SOLN
INTRAVENOUS | Status: AC
  Filled 2020-07-02: qty 100

## 2020-07-02 MED ORDER — OXYCODONE HCL 5 MG PO TABS: 5.0000 mg | ORAL_TABLET | ORAL | 0 refills | Status: DC | PRN

## 2020-07-02 MED ORDER — BUPIVACAINE-EPINEPHRINE (PF) 0.25% -1:200000 IJ SOLN
INTRAMUSCULAR | Status: AC
  Filled 2020-07-02: qty 30

## 2020-07-02 MED ORDER — BUPIVACAINE-EPINEPHRINE (PF) 0.25% -1:200000 IJ SOLN
INTRAMUSCULAR | Status: DC | PRN
  Administered 2020-07-02: 30 mL

## 2020-07-02 MED ORDER — DEXAMETHASONE SODIUM PHOSPHATE 10 MG/ML IJ SOLN
INTRAMUSCULAR | Status: DC | PRN
  Administered 2020-07-02: 10 mg via INTRAVENOUS

## 2020-07-02 MED ORDER — PROPOFOL 10 MG/ML IV BOLUS
INTRAVENOUS | Status: DC | PRN
  Administered 2020-07-02: 160 mg via INTRAVENOUS

## 2020-07-02 MED ORDER — ONDANSETRON HCL 4 MG/2ML IJ SOLN
INTRAMUSCULAR | Status: AC
  Filled 2020-07-02: qty 4

## 2020-07-02 MED ORDER — ROCURONIUM BROMIDE 10 MG/ML (PF) SYRINGE
PREFILLED_SYRINGE | INTRAVENOUS | Status: AC
  Filled 2020-07-02: qty 10

## 2020-07-02 MED ORDER — SUCCINYLCHOLINE CHLORIDE 20 MG/ML IJ SOLN
INTRAMUSCULAR | Status: DC | PRN
  Administered 2020-07-02: 120 mg via INTRAVENOUS

## 2020-07-02 MED ORDER — KETOROLAC TROMETHAMINE 30 MG/ML IJ SOLN: 30.0000 mg | Freq: Once | INTRAMUSCULAR | Status: DC | PRN

## 2020-07-02 MED ORDER — FENTANYL CITRATE (PF) 100 MCG/2ML IJ SOLN
INTRAMUSCULAR | Status: DC | PRN
  Administered 2020-07-02: 50 ug via INTRAVENOUS
  Administered 2020-07-02: 25 ug via INTRAVENOUS
  Administered 2020-07-02: 50 ug via INTRAVENOUS

## 2020-07-02 MED ORDER — ONDANSETRON HCL 4 MG/2ML IJ SOLN
INTRAMUSCULAR | Status: DC | PRN
  Administered 2020-07-02 (×2): 4 mg via INTRAVENOUS

## 2020-07-02 MED ORDER — KETOROLAC TROMETHAMINE 30 MG/ML IJ SOLN
INTRAMUSCULAR | Status: AC
  Filled 2020-07-02: qty 1

## 2020-07-02 MED ORDER — SUGAMMADEX SODIUM 500 MG/5ML IV SOLN
INTRAVENOUS | Status: DC | PRN
  Administered 2020-07-02: 400 mg via INTRAVENOUS

## 2020-07-02 SURGICAL SUPPLY — 57 items
ADH SKN CLS APL DERMABOND .7 (GAUZE/BANDAGES/DRESSINGS) ×1
APL PRP STRL LF DISP 70% ISPRP (MISCELLANEOUS) ×1
BAG INFUSER PRESSURE 100CC (MISCELLANEOUS) IMPLANT
BAG SPEC RTRVL LRG 6X4 10 (ENDOMECHANICALS) ×1
CANISTER SUCT 1200ML W/VALVE (MISCELLANEOUS) IMPLANT
CANNULA REDUC XI 12-8 STAPL (CANNULA) ×1
CANNULA REDUC XI 12-8MM STAPL (CANNULA) ×1
CANNULA REDUCER 12-8 DVNC XI (CANNULA) ×1 IMPLANT
CHLORAPREP W/TINT 26 (MISCELLANEOUS) ×3 IMPLANT
CLIP VESOLOCK MED LG 6/CT (CLIP) ×3 IMPLANT
COVER WAND RF STERILE (DRAPES) ×3 IMPLANT
CUP MEDICINE 2OZ PLAST GRAD ST (MISCELLANEOUS) ×3 IMPLANT
DECANTER SPIKE VIAL GLASS SM (MISCELLANEOUS) ×3 IMPLANT
DEFOGGER SCOPE WARMER CLEARIFY (MISCELLANEOUS) ×3 IMPLANT
DERMABOND ADVANCED (GAUZE/BANDAGES/DRESSINGS) ×2
DERMABOND ADVANCED .7 DNX12 (GAUZE/BANDAGES/DRESSINGS) ×1 IMPLANT
DRAPE ARM DVNC X/XI (DISPOSABLE) ×4 IMPLANT
DRAPE COLUMN DVNC XI (DISPOSABLE) ×1 IMPLANT
DRAPE DA VINCI XI ARM (DISPOSABLE) ×8
DRAPE DA VINCI XI COLUMN (DISPOSABLE) ×2
ELECT CAUTERY BLADE TIP 2.5 (TIP) ×3
ELECT REM PT RETURN 9FT ADLT (ELECTROSURGICAL) ×3
ELECTRODE CAUTERY BLDE TIP 2.5 (TIP) ×1 IMPLANT
ELECTRODE REM PT RTRN 9FT ADLT (ELECTROSURGICAL) ×1 IMPLANT
GLOVE SURG SYN 7.0 (GLOVE) ×6 IMPLANT
GLOVE SURG SYN 7.5  E (GLOVE) ×4
GLOVE SURG SYN 7.5 E (GLOVE) ×2 IMPLANT
GOWN STRL REUS W/ TWL LRG LVL3 (GOWN DISPOSABLE) ×4 IMPLANT
GOWN STRL REUS W/TWL LRG LVL3 (GOWN DISPOSABLE) ×12
IRRIGATOR SUCT 8 DISP DVNC XI (IRRIGATION / IRRIGATOR) IMPLANT
IRRIGATOR SUCTION 8MM XI DISP (IRRIGATION / IRRIGATOR)
IV NS 1000ML (IV SOLUTION)
IV NS 1000ML BAXH (IV SOLUTION) IMPLANT
KIT PINK PAD W/HEAD ARE REST (MISCELLANEOUS) ×3
KIT PINK PAD W/HEAD ARM REST (MISCELLANEOUS) ×1 IMPLANT
LABEL OR SOLS (LABEL) ×3 IMPLANT
MANIFOLD NEPTUNE II (INSTRUMENTS) ×3 IMPLANT
NEEDLE HYPO 22GX1.5 SAFETY (NEEDLE) ×3 IMPLANT
NS IRRIG 500ML POUR BTL (IV SOLUTION) ×3 IMPLANT
OBTURATOR OPTICAL STANDARD 8MM (TROCAR) ×2
OBTURATOR OPTICAL STND 8 DVNC (TROCAR) ×1
OBTURATOR OPTICALSTD 8 DVNC (TROCAR) ×1 IMPLANT
PACK LAP CHOLECYSTECTOMY (MISCELLANEOUS) ×3 IMPLANT
PENCIL ELECTRO HAND CTR (MISCELLANEOUS) ×3 IMPLANT
POUCH SPECIMEN RETRIEVAL 10MM (ENDOMECHANICALS) ×3 IMPLANT
SEAL CANN UNIV 5-8 DVNC XI (MISCELLANEOUS) ×4 IMPLANT
SEAL XI 5MM-8MM UNIVERSAL (MISCELLANEOUS) ×8
SET TUBE SMOKE EVAC HIGH FLOW (TUBING) ×3 IMPLANT
SOLUTION ELECTROLUBE (MISCELLANEOUS) ×3 IMPLANT
SPONGE LAP 18X18 RF (DISPOSABLE) IMPLANT
SPONGE LAP 4X18 RFD (DISPOSABLE) ×3 IMPLANT
STAPLER CANNULA SEAL DVNC XI (STAPLE) ×1 IMPLANT
STAPLER CANNULA SEAL XI (STAPLE) ×2
SUT MNCRL AB 4-0 PS2 18 (SUTURE) ×3 IMPLANT
SUT VICRYL 0 AB UR-6 (SUTURE) ×6 IMPLANT
TAPE TRANSPORE STRL 2 31045 (GAUZE/BANDAGES/DRESSINGS) ×3 IMPLANT
TROCAR BALLN GELPORT 12X130M (ENDOMECHANICALS) ×3 IMPLANT

## 2020-07-02 NOTE — Discharge Instructions (Signed)

## 2020-07-02 NOTE — Interval H&P Note (Signed)
History and Physical Interval Note:  07/02/2020 9:33 AM  Donna Buckley  has presented today for surgery, with the diagnosis of Symptomatic cholelithiasis.  The various methods of treatment have been discussed with the patient and family. After consideration of risks, benefits and other options for treatment, the patient has consented to  Procedure(s): XI ROBOTIC ASSISTED LAPAROSCOPIC CHOLECYSTECTOMY (N/A) INDOCYANINE GREEN FLUORESCENCE IMAGING (ICG) (N/A) as a surgical intervention.  The patient's history has been reviewed, patient examined, no change in status, stable for surgery.  I have reviewed the patient's chart and labs.  Questions were answered to the patient's satisfaction.     Ladonya Jerkins

## 2020-07-02 NOTE — Anesthesia Preprocedure Evaluation (Addendum)
Anesthesia Evaluation  Patient identified by MRN, date of birth, ID band Patient awake    Reviewed: Allergy & Precautions, H&P , NPO status , Patient's Chart, lab work & pertinent test results, reviewed documented beta blocker date and time   Airway Mallampati: II  TM Distance: >3 FB Neck ROM: full    Dental  (+) Teeth Intact   Pulmonary neg pulmonary ROS,    Pulmonary exam normal        Cardiovascular Exercise Tolerance: Good Normal cardiovascular exam     Neuro/Psych negative neurological ROS     GI/Hepatic negative GI ROS, Neg liver ROS, neg GERD  ,  Endo/Other  negative endocrine ROSModerate obesity  Renal/GU negative Renal ROS     Musculoskeletal negative musculoskeletal ROS (+)   Abdominal   Peds  Hematology negative hematology ROS (+)   Anesthesia Other Findings Past Medical History: No date: Medical history non-contributory Past Surgical History: 04/19/2020: CESAREAN SECTION; N/A     Comment:  Procedure: CESAREAN SECTION;  Surgeon: Vena Austria, MD;  Location: [facility] ORS;  Service: Obstetrics;                Laterality: N/A; Not breastfeeding  BMI    Body Mass Index: 38.02 kg/m     Reproductive/Obstetrics                           Anesthesia Physical Anesthesia Plan  ASA: II  Anesthesia Plan: General ETT   Post-op Pain Management:    Induction: Intravenous  PONV Risk Score and Plan: 3 and Ondansetron, Treatment may vary due to age or medical condition and Midazolam  Airway Management Planned: Oral ETT  Additional Equipment:   Intra-op Plan:   Post-operative Plan: Extubation in OR  Informed Consent: I have reviewed the patients History and Physical, chart, labs and discussed the procedure including the risks, benefits and alternatives for the proposed anesthesia with the patient or authorized representative who has indicated his/her understanding  and acceptance.     Dental Advisory Given  Plan Discussed with: CRNA  Anesthesia Plan Comments:         Anesthesia Quick Evaluation

## 2020-07-02 NOTE — Anesthesia Postprocedure Evaluation (Signed)
Anesthesia Post Note  Patient: Donna Buckley  Procedure(s) Performed: XI ROBOTIC ASSISTED LAPAROSCOPIC CHOLECYSTECTOMY (N/A ) INDOCYANINE GREEN FLUORESCENCE IMAGING (ICG) (N/A )  Patient location during evaluation: PACU Anesthesia Type: General Level of consciousness: awake and alert Pain management: pain level controlled Vital Signs Assessment: post-procedure vital signs reviewed and stable Respiratory status: spontaneous breathing, nonlabored ventilation and respiratory function stable Cardiovascular status: blood pressure returned to baseline and stable Postop Assessment: no apparent nausea or vomiting Anesthetic complications: no   No complications documented.   Last Vitals:  Vitals:   07/02/20 1255 07/02/20 1307  BP:  122/71  Pulse:  67  Resp:  18  Temp: 36.4 C (!) 36.4 C  SpO2:  100%    Last Pain:  Vitals:   07/02/20 1307  TempSrc: Temporal  PainSc: 0-No pain                 Christia Reading

## 2020-07-02 NOTE — Anesthesia Procedure Notes (Signed)
Procedure Name: Intubation Performed by: Mohammed Kindle, CRNA Pre-anesthesia Checklist: Patient identified, Emergency Drugs available, Suction available and Patient being monitored Patient Re-evaluated:Patient Re-evaluated prior to induction Oxygen Delivery Method: Circle system utilized Preoxygenation: Pre-oxygenation with 100% oxygen Induction Type: IV induction Ventilation: Mask ventilation without difficulty Tube type: Oral Tube size: 6.5 mm Number of attempts: 1 Airway Equipment and Method: Stylet and Oral airway Placement Confirmation: ETT inserted through vocal cords under direct vision,  positive ETCO2,  breath sounds checked- equal and bilateral and CO2 detector Secured at: 22 cm Tube secured with: Tape Dental Injury: Teeth and Oropharynx as per pre-operative assessment

## 2020-07-02 NOTE — Transfer of Care (Signed)
Immediate Anesthesia Transfer of Care Note  Patient: Donna Buckley  Procedure(s) Performed: XI ROBOTIC ASSISTED LAPAROSCOPIC CHOLECYSTECTOMY (N/A ) INDOCYANINE GREEN FLUORESCENCE IMAGING (ICG) (N/A )  Patient Location: PACU  Anesthesia Type:General  Level of Consciousness: drowsy, patient cooperative and responds to stimulation  Airway & Oxygen Therapy: Patient Spontanous Breathing and Patient connected to face mask oxygen  Post-op Assessment: Report given to RN and Post -op Vital signs reviewed and stable  Post vital signs: Reviewed and stable  Last Vitals:  Vitals Value Taken Time  BP 115/66 07/02/20 1202  Temp 36.6 C 07/02/20 1202  Pulse 95 07/02/20 1207  Resp 21 07/02/20 1207  SpO2 100 % 07/02/20 1207  Vitals shown include unvalidated device data.  Last Pain:  Vitals:   07/02/20 1202  TempSrc:   PainSc: Asleep         Complications: No complications documented.

## 2020-07-02 NOTE — Op Note (Signed)
  Procedure Date:  07/02/2020  Pre-operative Diagnosis:  Symptomatic cholelithiasis  Post-operative Diagnosis:  Symptomatic cholelithiasis  Procedure:  Robotic assisted cholecystectomy with ICG FireFly cholangiogram  Surgeon:  Howie Ill, MD  Anesthesia:  General endotracheal  Estimated Blood Loss:  5 ml  Specimens:  gallbladder  Complications:  None  Indications for Procedure:  This is a 19 y.o. female who presents with abdominal pain and workup revealing symptomatic cholelithiasis.  The benefits, complications, treatment options, and expected outcomes were discussed with the patient. The risks of bleeding, infection, recurrence of symptoms, failure to resolve symptoms, bile duct damage, bile duct leak, retained common bile duct stone, bowel injury, and need for further procedures were all discussed with the patient and she was willing to proceed.  Description of Procedure: The patient was correctly identified in the preoperative area and brought into the operating room.  The patient was placed supine with VTE prophylaxis in place.  Appropriate time-outs were performed.  Anesthesia was induced and the patient was intubated.  Appropriate antibiotics were infused.  The abdomen was prepped and draped in a sterile fashion. An infraumbilical incision was made. A cutdown technique was used to enter the abdominal cavity without injury, and a 12 mm robotic port was inserted.  Pneumoperitoneum was obtained with appropriate opening pressures.  Three 8-mm ports were placed in the mid abdomen at the level of the umbilicus under direct visualization.  The DaVinci platform was docked, camera targeted, and instruments were placed under direct visualization.  The gallbladder was identified.  The fundus was grasped and retracted cephalad.  Adhesions were lysed bluntly and with electrocautery. The infundibulum was grasped and retracted laterally, exposing the peritoneum overlying the gallbladder.   This was incised with electrocautery and extended on either side of the gallbladder.  FireFly cholangiogram was then obtained, and we were able to clearly identify the cystic duct and common bile duct.  The cystic duct and cystic artery were carefully dissected with combination of cautery and blunt dissection.  Both were clipped twice proximally and once distally, cutting in between.  The gallbladder was taken from the gallbladder fossa in a retrograde fashion with electrocautery. The gallbladder was placed in an Endocatch bag. The liver bed was inspected and any bleeding was controlled with electrocautery. The right upper quadrant was then inspected again revealing intact clips, no bleeding, and no ductal injury.  The 8 mm ports were removed under direct visualization and the 12 mm port was removed.  The Endocatch bag was brought out via the umbilical incision. The fascial opening was closed using 0 vicryl suture.  Local anesthetic was infused in all incisions and the incisions were closed with 4-0 Monocryl.  The wounds were cleaned and sealed with DermaBond.  The patient was emerged from anesthesia and extubated and brought to the recovery room for further management.  The patient tolerated the procedure well and all counts were correct at the end of the case.   Howie Ill, MD

## 2020-07-03 LAB — SURGICAL PATHOLOGY

## 2020-07-20 ENCOUNTER — Ambulatory Visit (INDEPENDENT_AMBULATORY_CARE_PROVIDER_SITE_OTHER): Payer: PRIVATE HEALTH INSURANCE | Admitting: Physician Assistant

## 2020-07-20 ENCOUNTER — Encounter: Payer: Self-pay | Admitting: Physician Assistant

## 2020-07-20 ENCOUNTER — Other Ambulatory Visit: Payer: Self-pay

## 2020-07-20 VITALS — BP 133/88 | HR 83 | Temp 98.8°F | Ht 62.0 in | Wt 208.0 lb

## 2020-07-20 DIAGNOSIS — K8020 Calculus of gallbladder without cholecystitis without obstruction: Secondary | ICD-10-CM

## 2020-07-20 DIAGNOSIS — Z09 Encounter for follow-up examination after completed treatment for conditions other than malignant neoplasm: Secondary | ICD-10-CM

## 2020-07-20 NOTE — Patient Instructions (Signed)

## 2020-07-20 NOTE — Progress Notes (Signed)
[facility] POST-OP OFFICE VISIT  07/20/2020  HPI: Donna Buckley is a 19 y.o. female 18 days s/p robotic assisted laparoscopic cholecystectomy for symptomatic cholelithiasis with Dr Aleen Campi.  She is doing well No complaints of abdominal pain. She only required pain medications for a few days at most No fever, chills, nausea, emesis, diarrhea, or constipation No problems with her incisions Tolerating PO intake without issue  Vital signs: BP 133/88   Pulse 83   Temp 98.8 F (37.1 C) (Oral)   Ht 5\' 2"  (1.575 m)   Wt 208 lb (94.3 kg)   LMP 06/27/2020   SpO2 98%   BMI 38.04 kg/m    Physical Exam: Constitutional: Well appearing female, NAD Abdomen: Soft, non-tender, non-distended, no rebound/guarding Skin: Laparoscopic incisions are well healed, no erythema or drainage   Assessment/Plan: This is a 19 y.o. female 18 days s/p robotic assisted laparoscopic cholecystectomy for symptomatic cholelithiasis   - Pain control prn  - Reviewed lifting restrictions  - Reviewed pathology: Mild chronic cholecystitis, negative for malignancy  - She will RTC on an as needed basis  -- 26, PA-C [facility] 07/20/2020, 10:41 AM [phone] M-F: 7am - 4pm

## 2021-11-08 IMAGING — MR MR ABDOMEN WO/W CM MRCP
18 of 22 series · 42 of 48 positions shown · IV contrast (9ml Gadavist)
Comparison: None. Findings are correlated with right upper quadrant
sonogram performed earlier.

CLINICAL DATA: Elevated liver function tests, cholelithiasis, right
upper quadrant abdominal pain

EXAM:
MRI ABDOMEN WITHOUT AND WITH CONTRAST (INCLUDING MRCP)
TECHNIQUE: Multiplanar multisequence MR imaging of the abdomen was performed
both before and after the administration of intravenous contrast.
Heavily T2-weighted images of the biliary and pancreatic ducts were
obtained, and three-dimensional MRCP images were rendered by post
processing.
CONTRAST:  9mL GADAVIST GADOBUTROL 1 MMOL/ML IV SOLN

[Series 4: T2 · coronal · 6.0mm · 1.19mm/px · 1 of 30 slices shown (1 of 2)]
[im 1/30]
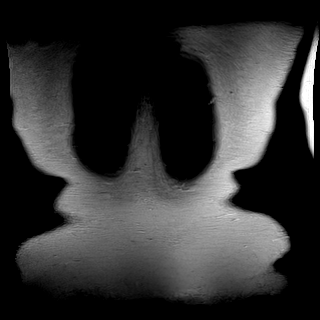

[Series 5: T2 · axial · 6.0mm · 1.19mm/px · 1 of 36 slices shown (2 of 2)]
[im 1/36]
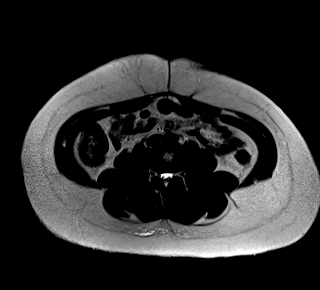

[Series 6: T1 · axial · 6.0mm · 0.74mm/px · z∈[-14,+238]mm · 2 of 72 slices shown]
[im 1/72]
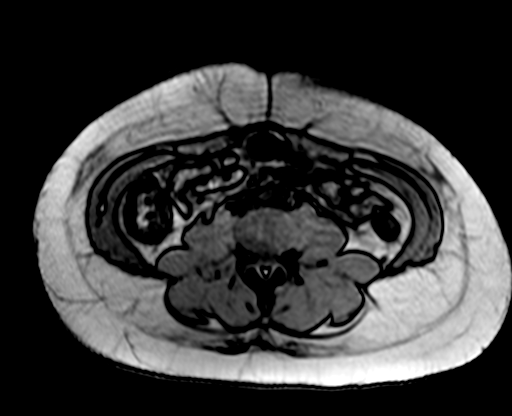
[im 72/72]
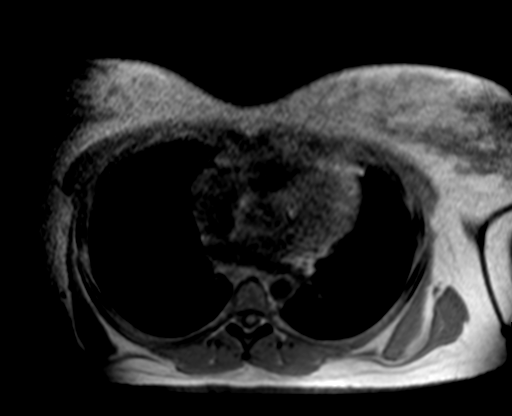

[Series 9: T2 fat-sat · axial · 6.0mm · 1.19mm/px · 1 of 36 slices shown]
[im 1/36]
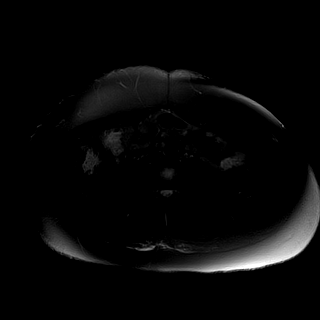

[Series 10: ax dwi_tracew · axial · 6.0mm · 1.42mm/px · z∈[-14,+238]mm · 4 of 108 slices shown]
[im 1/108]
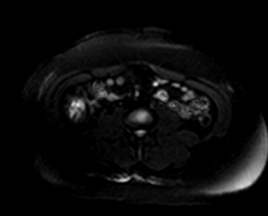
[im 36/108]
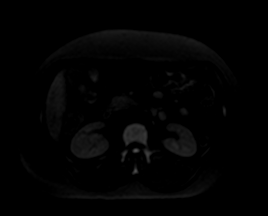
[im 72/108]
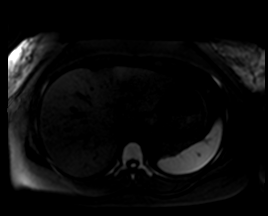
[im 108/108]
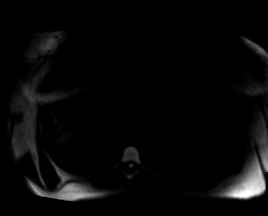

[Series 11: ax dwi_adc · axial · 6.0mm · 1.42mm/px · 1 of 36 slices shown]
[im 1/36]
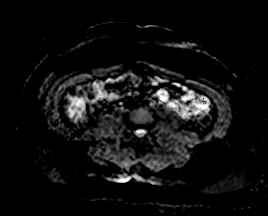

[Series 15: MRCP · coronal · 3.0mm · 1.12mm/px · 1 of 19 slices shown]
[im 1/19]
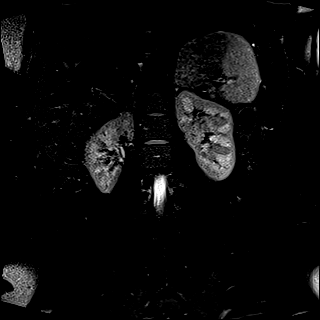

[Series 16: radials · coronal · 50.0mm · 0.78mm/px · 1 of 5 slices shown]
[im 1/5]
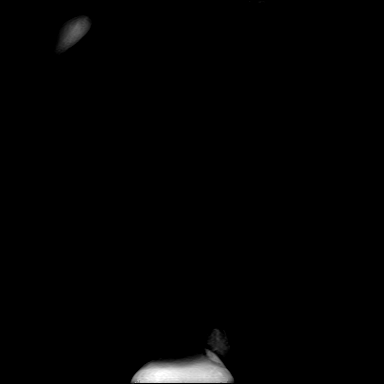

[Series 17: T1 dynamic fat-sat · axial · non-contrast · 3.5mm · 1.19mm/px · z∈[-13,+236]mm · 3 of 72 slices shown (1 of 5)]
[im 1/72]
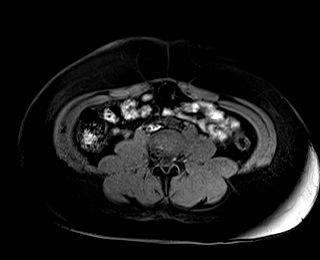
[im 36/72]
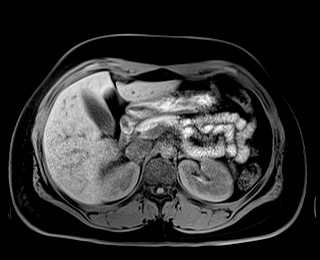
[im 72/72]
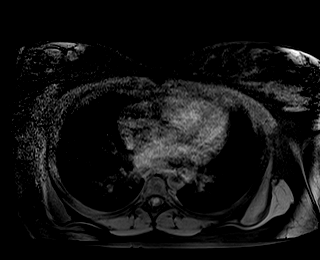

[Series 18: T1 dynamic fat-sat post-contrast · axial · 3.5mm · 1.19mm/px · z∈[-13,+236]mm · 3 of 72 slices shown (1 of 4)]
[im 1/72]
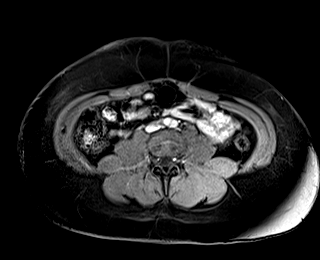
[im 36/72]
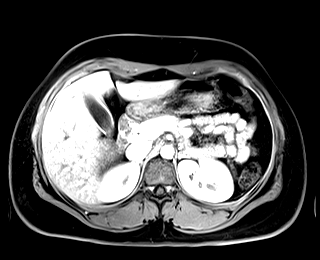
[im 72/72]
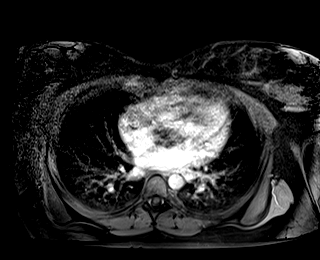

[Series 19: T1 dynamic fat-sat · axial · 3.5mm · 1.19mm/px · z∈[-13,+236]mm · 3 of 72 slices shown (2 of 5)]
[im 1/72]
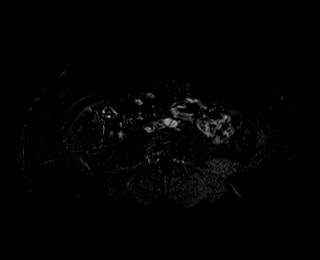
[im 36/72]
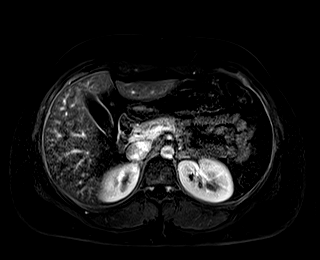
[im 72/72]
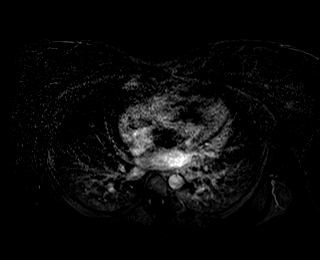

[Series 20: T1 dynamic fat-sat post-contrast · axial · 3.5mm · 1.19mm/px · z∈[-13,+236]mm · 3 of 72 slices shown (2 of 4)]
[im 1/72]
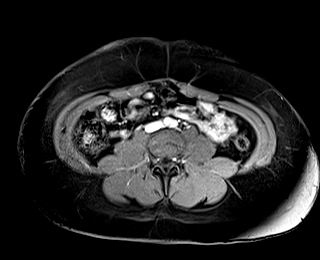
[im 36/72]
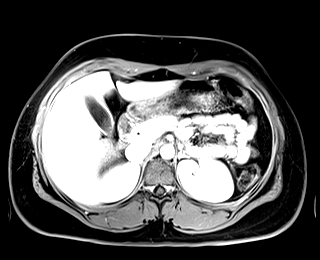
[im 72/72]
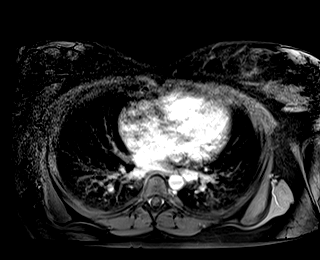

[Series 21: T1 dynamic fat-sat · axial · 3.5mm · 1.19mm/px · z∈[-13,+236]mm · 3 of 72 slices shown (3 of 5)]
[im 1/72]
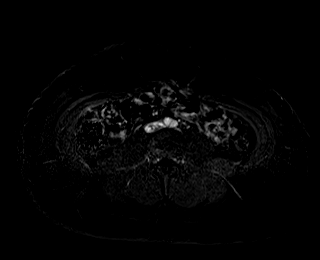
[im 36/72]
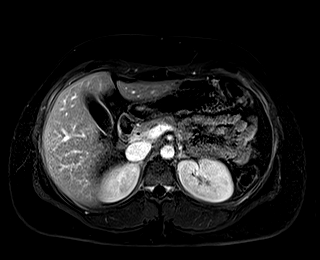
[im 72/72]
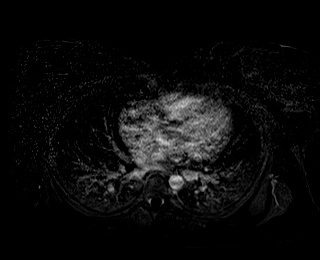

[Series 22: T1 dynamic fat-sat post-contrast · axial · 3.5mm · 1.19mm/px · z∈[-13,+236]mm · 3 of 72 slices shown (3 of 4)]
[im 1/72]
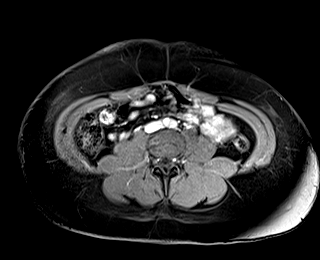
[im 36/72]
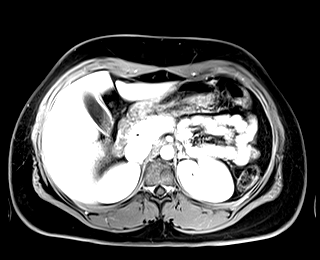
[im 72/72]
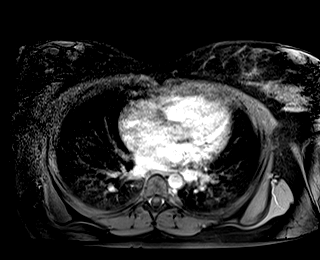

[Series 23: T1 dynamic fat-sat · axial · 3.5mm · 1.19mm/px · z∈[-13,+236]mm · 3 of 72 slices shown (4 of 5)]
[im 1/72]
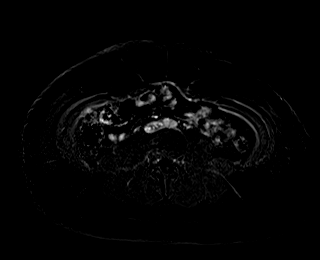
[im 36/72]
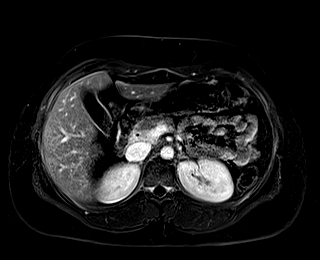
[im 72/72]
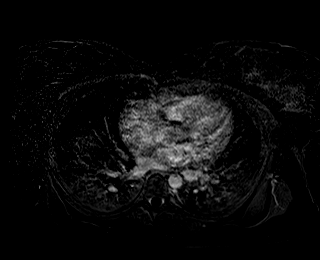

[Series 24: T1 dynamic post-contrast · coronal · 3.0mm · 1.31mm/px · 3 of 72 slices shown]
[im 1/72]
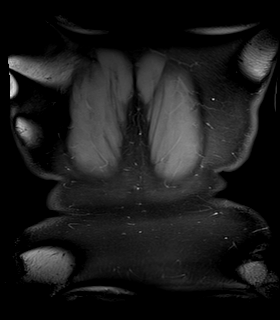
[im 36/72]
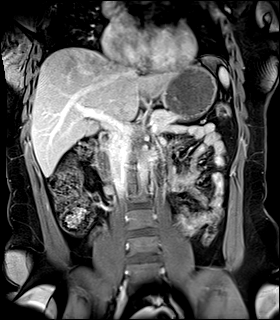
[im 72/72]
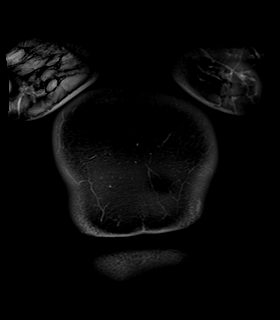

[Series 25: T1 dynamic fat-sat post-contrast · axial · 3.5mm · 1.19mm/px · z∈[-13,+236]mm · 3 of 72 slices shown (4 of 4)]
[im 1/72]
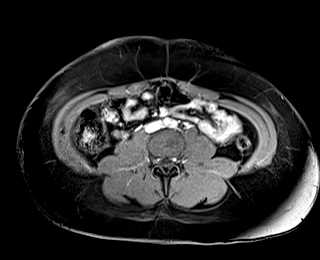
[im 36/72]
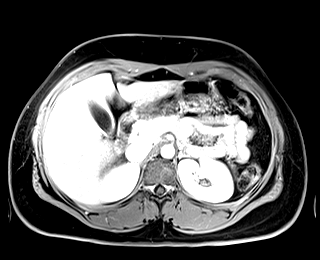
[im 72/72]
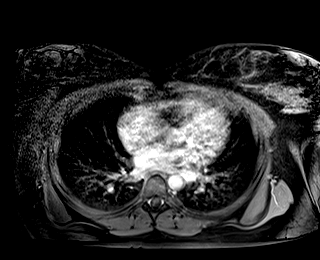

[Series 26: T1 dynamic fat-sat · axial · 3.5mm · 1.19mm/px · z∈[-13,+236]mm · 3 of 72 slices shown (5 of 5)]
[im 1/72]
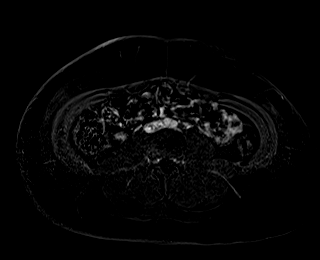
[im 36/72]
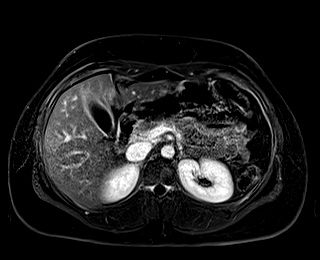
[im 72/72]
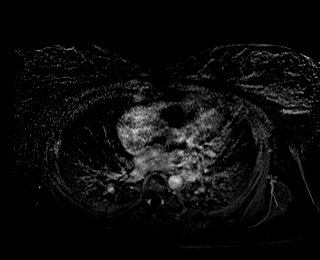

[42 of 48 positions shown; findings below may reference images not displayed]

FINDINGS: Lower chest: No acute findings.

Hepatobiliary: The liver is normal. No intrahepatic masses are
identified. There is no intrahepatic biliary ductal dilation. Tiny
gallstone is seen within the a gallbladder neck. The gallbladder is
otherwise unremarkable. No pericholecystic inflammatory changes
identified. The extrahepatic bile duct is normal in contour and
caliber. No intraluminal filling defect is identified.

Pancreas:  Unremarkable.  No divisum.

Spleen:  Unremarkable

Adrenals/Urinary Tract: The adrenal glands are unremarkable. The
kidneys are normal. No hydronephrosis.

Stomach/Bowel: The visualized bowel is unremarkable. No free fluid
within the visualized peritoneum.

Vascular/Lymphatic: Unremarkable. Specifically, the splenic vein,
superior mesenteric vein, and portal vein are patent. No pathologic
adenopathy within the abdomen.

Other:  None significant

Musculoskeletal: Normal marrow signal
IMPRESSION: Cholelithiasis without superimposed inflammatory change. Otherwise
unremarkable examination.

## 2021-11-08 IMAGING — US US ABDOMEN LIMITED
1 series · 14 of 25 positions shown · non-contrast
Comparison: None.

CLINICAL DATA: Right upper quadrant pain for 1 month with elevated
LFTs

EXAM:
ULTRASOUND ABDOMEN LIMITED RIGHT UPPER QUADRANT

[Series 1: us abdomen limited ruq (liver/gb) · 14 of 85 slices shown]
[im 1/85]
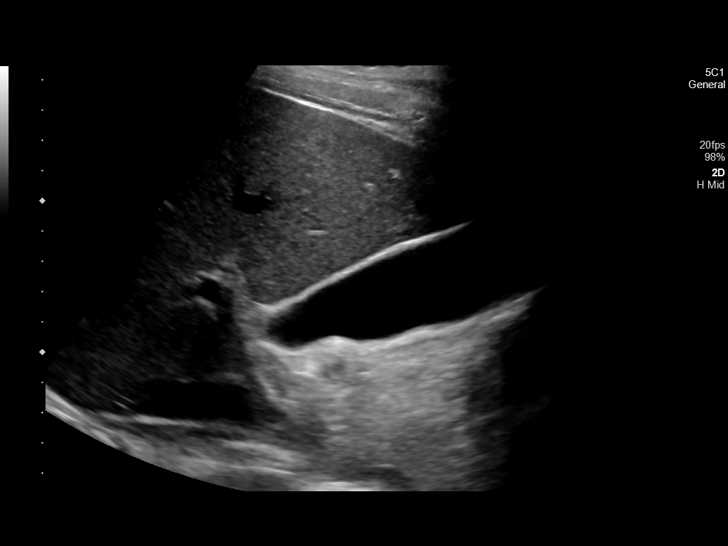
[im 8/85]
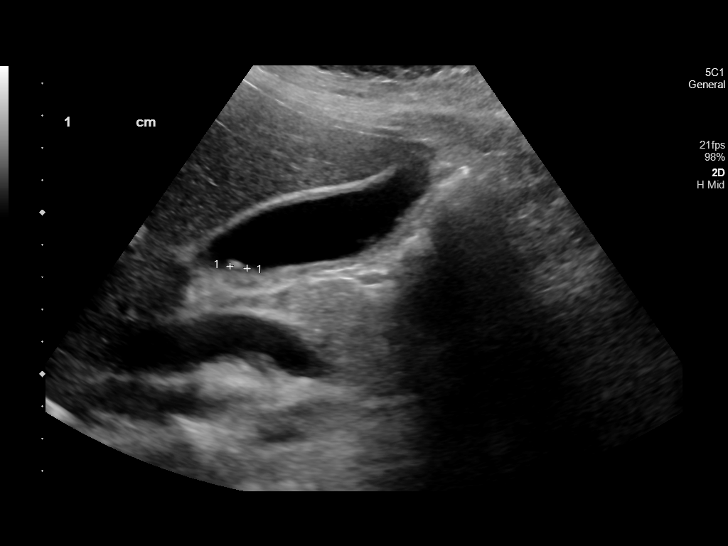
[im 15/85]
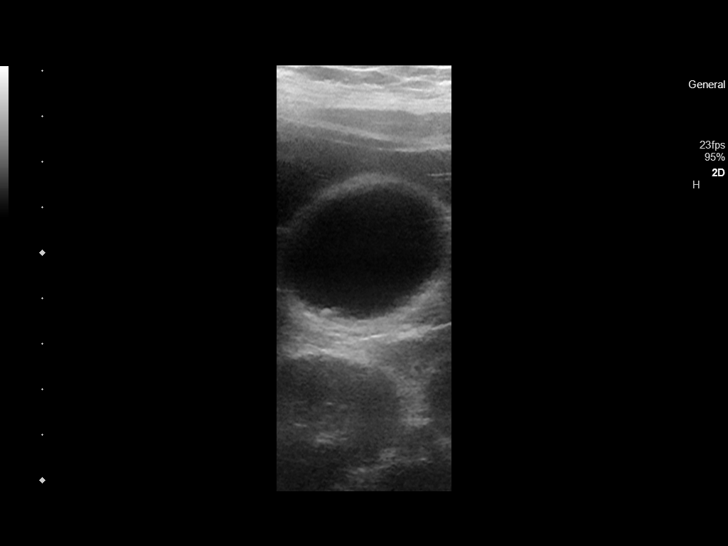
[im 22/85]
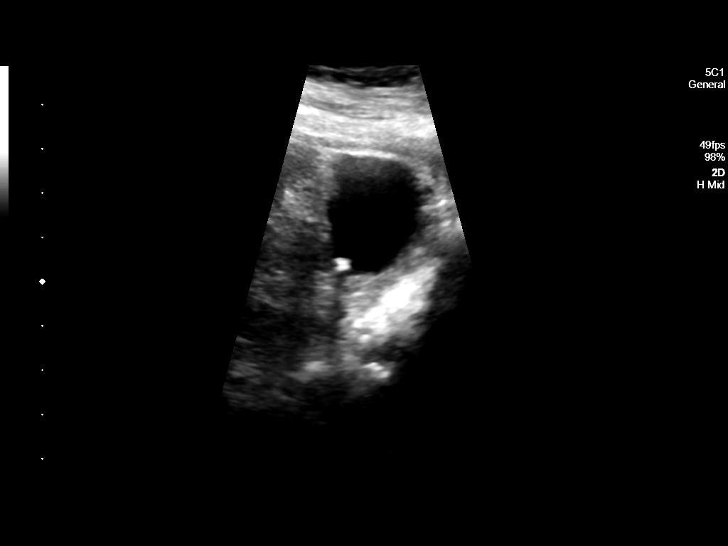
[im 29/85]
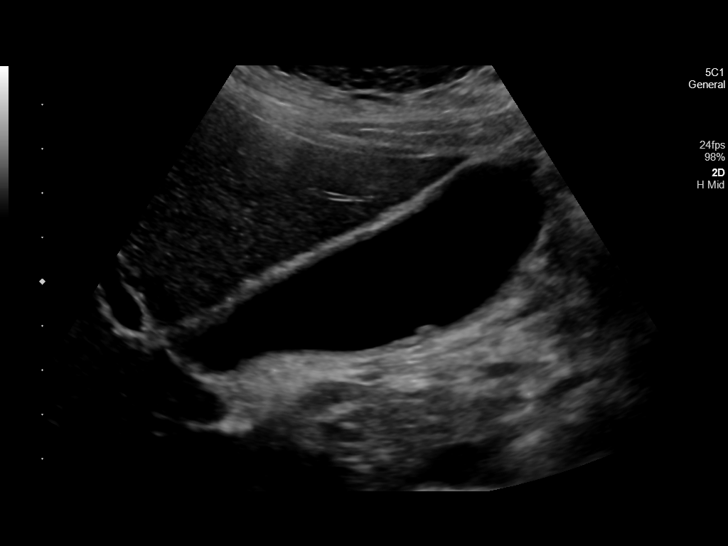
[im 32/85]
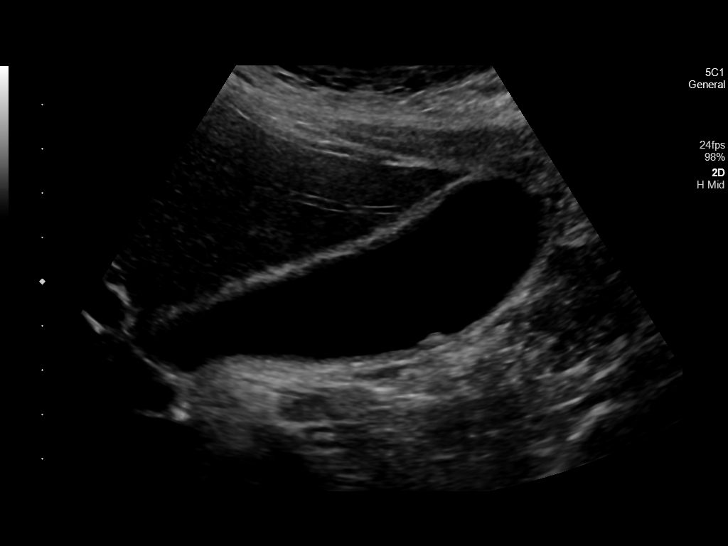
[im 39/85]
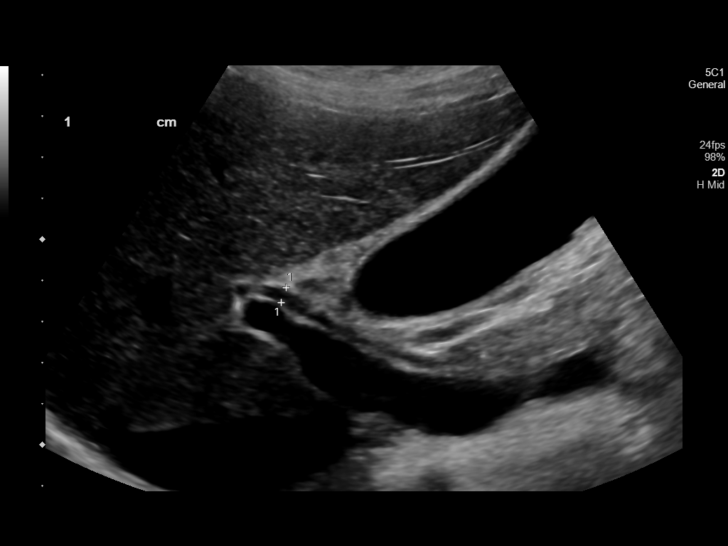
[im 46/85]
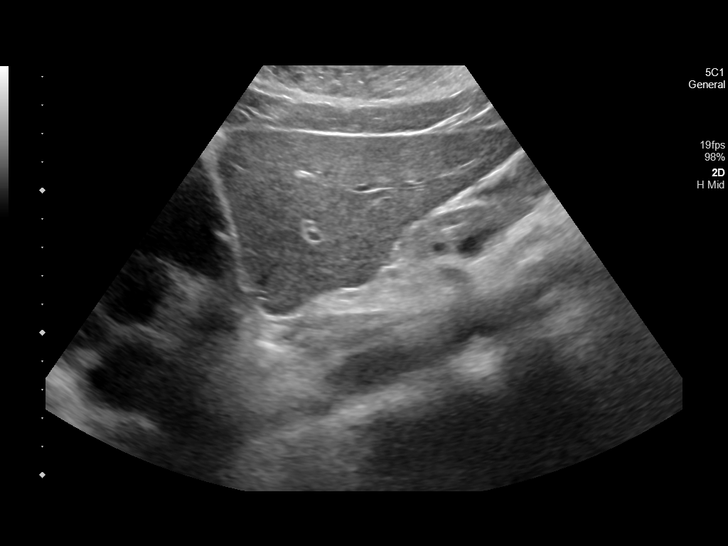
[im 53/85]
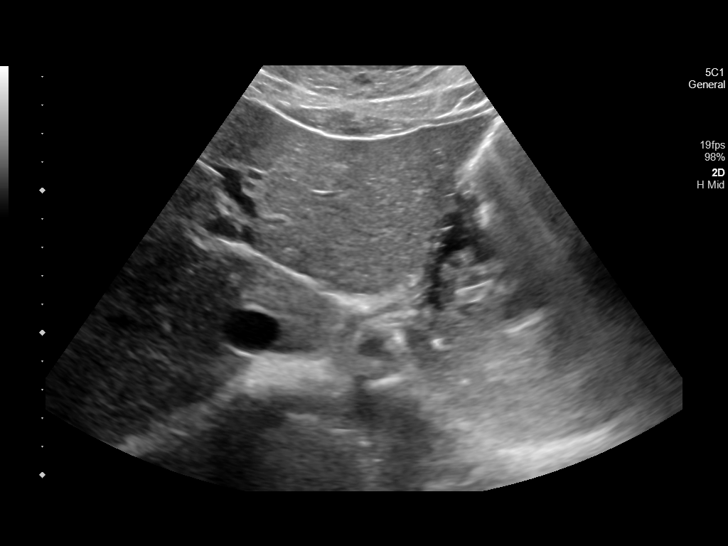
[im 57/85]
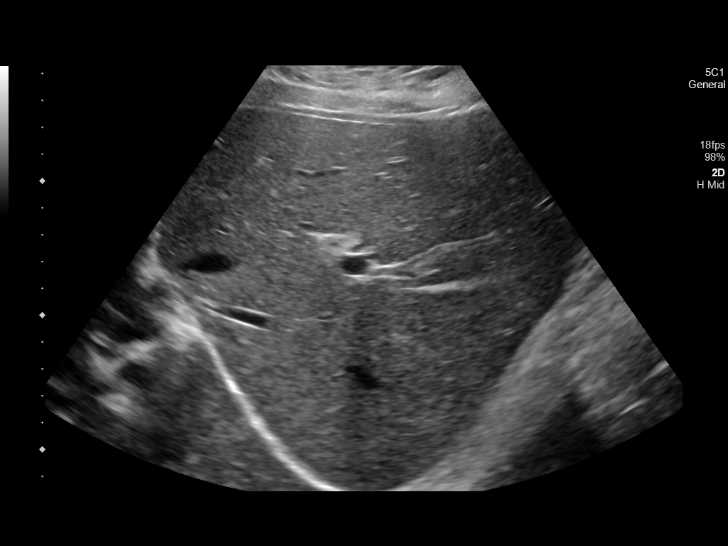
[im 64/85]
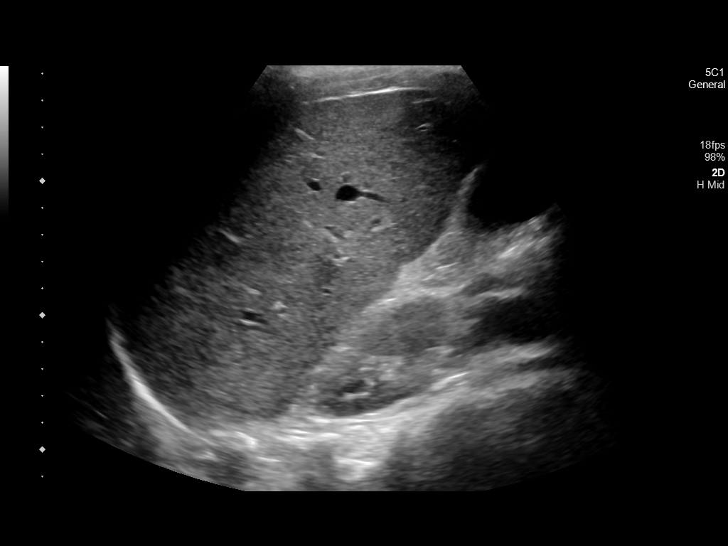
[im 71/85]
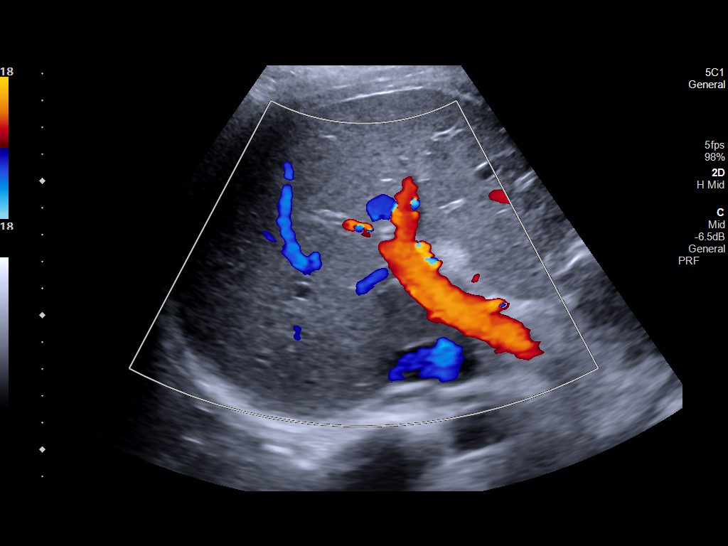
[im 78/85]
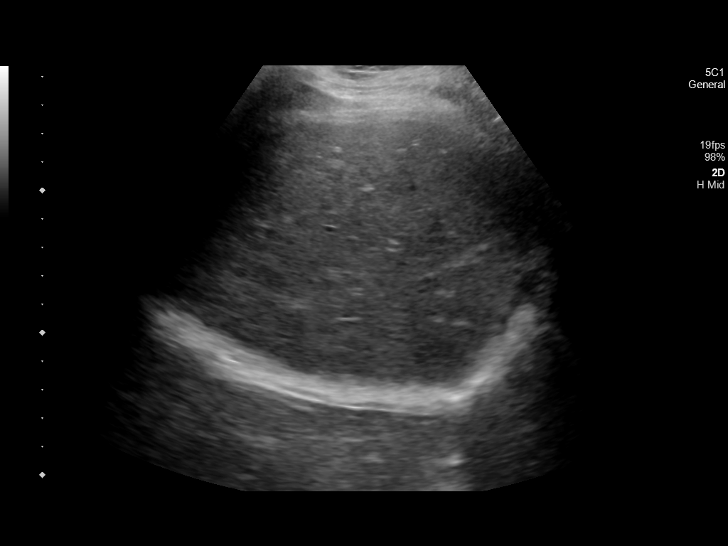
[im 85/85]
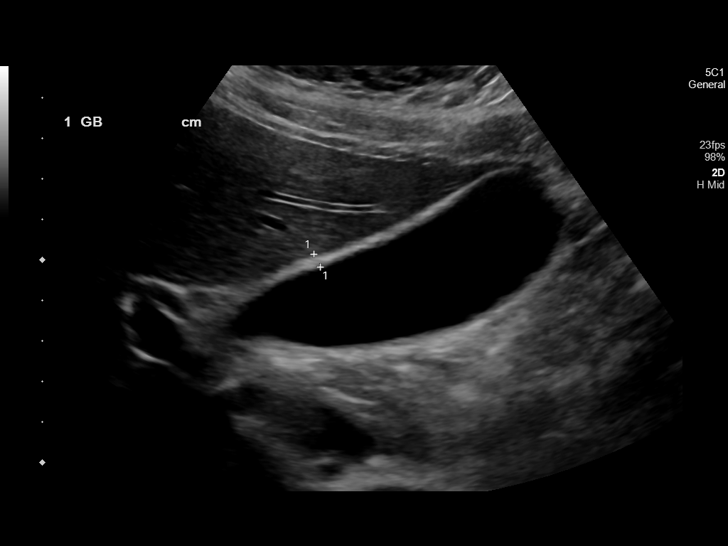

[14 of 25 positions shown; findings below may reference images not displayed]

FINDINGS: Gallbladder:

Gallbladder is partially distended. Multiple small echogenicities
are noted consistent with gallstones. No wall thickening or
pericholecystic fluid is noted. Negative sonographic Murphy's sign
is elicited.

Common bile duct:

Diameter: 3.8 mm.

Liver:

No focal lesion identified. Within normal limits in parenchymal
echogenicity. Portal vein is patent on color Doppler imaging with
normal direction of blood flow towards the liver.

Other: None.
IMPRESSION: Multiple small gallstones.

Partial distension of the gallbladder consistent with the
postprandial state.

No other focal abnormality is noted.

## 2023-03-25 ENCOUNTER — Emergency Department
Admission: EM | Admit: 2023-03-25 | Discharge: 2023-03-26 | Disposition: A | Discharge status: Home / Self Care | Payer: PRIVATE HEALTH INSURANCE | Attending: Emergency Medicine | Admitting: Emergency Medicine

## 2023-03-25 ENCOUNTER — Other Ambulatory Visit: Payer: Self-pay

## 2023-03-25 DIAGNOSIS — R0602 Shortness of breath: Secondary | ICD-10-CM | POA: Diagnosis present

## 2023-03-25 DIAGNOSIS — I2699 Other pulmonary embolism without acute cor pulmonale: Secondary | ICD-10-CM | POA: Insufficient documentation

## 2023-03-25 DIAGNOSIS — R109 Unspecified abdominal pain: Secondary | ICD-10-CM | POA: Diagnosis not present

## 2023-03-25 NOTE — ED Triage Notes (Signed)
C/O right thoracic back pain x 1 week.  Pain with deep breathing and movement.  Denies injury.  AAOx3.  Skin warm and dry. NAD.

## 2023-03-26 ENCOUNTER — Emergency Department: Payer: No Typology Code available for payment source

## 2023-03-26 LAB — CBC WITH DIFFERENTIAL/PLATELET
Abs Immature Granulocytes: 0.03 10*3/uL (ref 0.00–0.07)
Basophils Absolute: 0 10*3/uL (ref 0.0–0.1)
Basophils Relative: 0 %
Eosinophils Absolute: 0.1 10*3/uL (ref 0.0–0.5)
Eosinophils Relative: 1 %
HCT: 38.5 % (ref 36.0–46.0)
Hemoglobin: 12.6 g/dL (ref 12.0–15.0)
Immature Granulocytes: 0 %
Lymphocytes Relative: 31 %
Lymphs Abs: 2.9 10*3/uL (ref 0.7–4.0)
MCH: 26 pg (ref 26.0–34.0)
MCHC: 32.7 g/dL (ref 30.0–36.0)
MCV: 79.4 fL — ABNORMAL LOW (ref 80.0–100.0)
Monocytes Absolute: 0.8 10*3/uL (ref 0.1–1.0)
Monocytes Relative: 9 %
Neutro Abs: 5.4 10*3/uL (ref 1.7–7.7)
Neutrophils Relative %: 59 %
Platelets: 408 10*3/uL — ABNORMAL HIGH (ref 150–400)
RBC: 4.85 MIL/uL (ref 3.87–5.11)
RDW: 13.5 % (ref 11.5–15.5)
WBC: 9.2 10*3/uL (ref 4.0–10.5)
nRBC: 0 % (ref 0.0–0.2)

## 2023-03-26 LAB — BASIC METABOLIC PANEL WITH GFR
Anion gap: 8 (ref 5–15)
BUN: 9 mg/dL (ref 6–20)
CO2: 25 mmol/L (ref 22–32)
Calcium: 8.7 mg/dL — ABNORMAL LOW (ref 8.9–10.3)
Chloride: 105 mmol/L (ref 98–111)
Creatinine, Ser: 0.77 mg/dL (ref 0.44–1.00)
GFR, Estimated: >60 mL/min (ref >60)
Glucose, Bld: 96 mg/dL (ref 70–99)
Potassium: 3.5 mmol/L (ref 3.5–5.1)
Sodium: 138 mmol/L (ref 135–145)

## 2023-03-26 LAB — POC URINE PREG, ED: Preg Test, Ur: NEGATIVE

## 2023-03-26 MED ORDER — MORPHINE SULFATE (PF) 4 MG/ML IV SOLN
4.0000 mg | Freq: Once | INTRAVENOUS | Status: AC
  Administered 2023-03-26: 4 mg via INTRAVENOUS
  Filled 2023-03-26: qty 1

## 2023-03-26 MED ORDER — HYDROCODONE-ACETAMINOPHEN 5-325 MG PO TABS: 2.0000 | ORAL_TABLET | Freq: Four times a day (QID) | ORAL | 0 refills | Status: DC | PRN

## 2023-03-26 MED ORDER — APIXABAN 5 MG PO TABS: ORAL_TABLET | ORAL | 2 refills | Status: DC

## 2023-03-26 MED ORDER — IOHEXOL 350 MG/ML SOLN
75.0000 mL | Freq: Once | INTRAVENOUS | Status: AC | PRN
  Administered 2023-03-26: 75 mL via INTRAVENOUS

## 2023-03-26 MED ORDER — KETOROLAC TROMETHAMINE 30 MG/ML IJ SOLN
15.0000 mg | Freq: Once | INTRAMUSCULAR | Status: AC
  Administered 2023-03-26: 15 mg via INTRAVENOUS
  Filled 2023-03-26: qty 1

## 2023-03-26 MED ORDER — ONDANSETRON HCL 4 MG/2ML IJ SOLN
4.0000 mg | INTRAMUSCULAR | Status: AC
  Administered 2023-03-26: 4 mg via INTRAVENOUS
  Filled 2023-03-26: qty 2

## 2023-03-26 NOTE — ED Notes (Signed)
Patient transported to CT 

## 2023-03-26 NOTE — Discharge Instructions (Addendum)
It is very important that you get your prescription filled today and take the medication according to the instructions.  We provided referrals for both primary care and for the hematology clinic.  You should follow-up with them to help guide your therapy and any additional investigation regarding why you developed the pulmonary embolism.  You can take ibuprofen and Tylenol according to label instructions as needed for pain.  Take Norco as prescribed for severe pain. Do not drink alcohol, drive or participate in any other potentially dangerous activities while taking this medication as it may make you sleepy. Do not take this medication with any other sedating medications, either prescription or over-the-counter. If you were prescribed Percocet or Vicodin, do not take these with acetaminophen (Tylenol) as it is already contained within these medications.   This medication is an opiate (or narcotic) pain medication and can be habit forming.  Use it as little as possible to achieve adequate pain control.  Do not use or use it with extreme caution if you have a history of opiate abuse or dependence.  If you are on a pain contract with your primary care doctor or a pain specialist, be sure to let them know you were prescribed this medication today from the [facility] Emergency Department.  This medication is intended for your use only - do not give any to anyone else and keep it in a secure place where nobody else, especially children, have access to it.  It will also cause or worsen constipation, so you may want to consider taking an over-the-counter stool softener while you are taking this medication.

## 2023-03-26 NOTE — ED Provider Notes (Signed)
[facility] Provider Note    Event Date/Time   First MD Initiated Contact with Patient 03/25/23 2318     (approximate)   History   Back Pain   HPI Donna Buckley is a 22 y.o. female who presents for evaluation of pain in her back as well as radiating through to her chest.  She said that it is in the middle of her back but a little bit over on the right side near her flank.  It comes and goes but when it comes it takes her breath away a little bit even though otherwise she does not feel short of breath.  She said that sometimes it feels like something is moving around on the inside.  She has had no urinary symptoms such as increased urinary frequency or dysuria or hematuria.  She has no history of any chronic medical issues including diabetes, high blood pressure, kidney disease, or prior episodes of kidney stones.  No history of urinary tract infections or pyelonephritis.    She has not been on any long trips recently and has no unilateral leg pain or swelling.  She is unaware of any family members who have blood clots.     Physical Exam   Triage Vital Signs: ED Triage Vitals  Encounter Vitals Group     BP 03/25/23 1845 138/88     Systolic BP Percentile --      Diastolic BP Percentile --      Pulse Rate 03/25/23 1844 74     Resp 03/25/23 1844 16     Temp 03/25/23 1844 99.2 F (37.3 C)     Temp Source 03/25/23 1844 Oral     SpO2 03/25/23 1844 100 %     Weight 03/25/23 1845 94.3 kg (207 lb 14.3 oz)     Height 03/25/23 1845 1.575 m (5\' 2" )     Head Circumference --      Peak Flow --      Pain Score 03/25/23 1844 6     Pain Loc --      Pain Education --      Exclude from Growth Chart --     Most recent vital signs: Vitals:   03/26/23 0200 03/26/23 0246  BP:  126/74  Pulse: 94 84  Resp:  16  Temp:  98.2 F (36.8 C)  SpO2: 99% 99%    General: Awake, no obvious distress. CV:  Good peripheral perfusion.  Regular rate and rhythm, normal heart  sounds. Resp:  Normal effort. Speaking easily and comfortably, no accessory muscle usage nor intercostal retractions.  Lungs are clear to auscultation bilaterally. Abd:  No distention.  No tenderness to palpation.  Obese. Other:  Patient has some right-sided flank tenderness to percussion but is very minimal.   ED Results / Procedures / Treatments   Labs (all labs ordered are listed, but only abnormal results are displayed) Labs Reviewed  CBC WITH DIFFERENTIAL/PLATELET - Abnormal; Notable for the following components:      Result Value   MCV 79.4 (*)    Platelets 408 (*)    All other components within normal limits  BASIC METABOLIC PANEL - Abnormal; Notable for the following components:   Calcium 8.7 (*)    All other components within normal limits  POC URINE PREG, ED     PROCEDURES:  Critical Care performed: No  Procedures    IMPRESSION / MDM / ASSESSMENT AND PLAN / ED COURSE  I reviewed the triage  vital signs and the nursing notes.                              Differential diagnosis includes, but is not limited to, renal/ureteral colic, UTI/pyelonephritis, pneumonia, PE, musculoskeletal strain.  Patient's presentation is most consistent with acute presentation with potential threat to life or bodily function.  Labs/studies ordered: Basic metabolic panel, CBC with differential, urine pregnancy test  Interventions/Medications given:  Medications  morphine (PF) 4 MG/ML injection 4 mg (4 mg Intravenous Given 03/26/23 0108)  ondansetron (ZOFRAN) injection 4 mg (4 mg Intravenous Given 03/26/23 0108)  ketorolac (TORADOL) 30 MG/ML injection 15 mg (15 mg Intravenous Given 03/26/23 0108)  iohexol (OMNIPAQUE) 350 MG/ML injection 75 mL (75 mLs Intravenous Contrast Given 03/26/23 0119)   (Note:  hospital course my include additional interventions and/or labs/studies not listed above.)   Patient would be PERC negative although she says that she is on oral contraceptives;  uncertain what type and if this increases risk of venous thromboembolism.  However, based on the location of her pain in her right flank and a Wells score for PE of 0, I think it is much more likely she has a ureteral stone.  We will proceed with CT renal stone protocol and will request a urinalysis as well to look for signs of hematuria.  If no source of her pain is identified on the CT renal stone protocol, I may proceed with CTA chest.  I considered chest x-ray, but given no respiratory symptoms, tachycardia, tachypnea, nor hypoxia, I find pneumonia to be less likely.   Clinical Course as of 03/26/23 8295  Wynelle Link Mar 26, 2023  6213 Received call from radiologist with concerns that the patient may have an infarction in her right lower lobe.  She highly recommended that we send the patient back over for CTA chest PE protocol.  I have ordered this study and will explain to the patient why we are doing so. [CF]  0216 CT Angio Chest PE W/Cm &/Or Wo Cm Viewed and interpreted CTA chest and verified the presence of an area of infarction.  I also spoke with the radiologist who confirmed a segmental and subsegmental infarction on the right.  Also confirmed no evidence of heart strain.  Patient remains hemodynamically stable with no oxygen requirement.  I talked with her about the diagnosis and the need for anticoagulation.  We did the risk and benefits discussion of Eliquis and she does want to proceed.  Given that she can pick up the prescription in the morning we will do that rather than starting treatment in the emergency department, particularly given her hemodynamic stability and waiting a few hours for her prescription to be filled should be fine.  I will refer her to hematology to follow-up in case there is any genetic testing that needs to be done, and I will also refer her for primary care establishment.  Patient understands and agrees with the plan and the severity of her diagnosis and need for follow-up  and treatment. [CF]    Clinical Course User Index [CF] Loleta Rose, MD     FINAL CLINICAL IMPRESSION(S) / ED DIAGNOSES   Final diagnoses:  Acute pulmonary embolism without acute cor pulmonale, unspecified pulmonary embolism type [facility])     Rx / DC Orders   ED Discharge Orders          Ordered    Ambulatory referral to Hematology / Oncology  Comments: Your emergency department provider has referred you to see a hematology/oncology specialist. These are physicians who specialize in blood disorders and cancers, or findings concerning for cancer. You will receive a phone call from the [facility] Office to set up your appointment within 2 business days: [employer] operate Mon - Fri, 8:00 a.m. to 5:00 p.m.; closed for federally recognized holidays. Please be sure your phone is not set to block numbers during this time.   03/26/23 0219    Ambulatory Referral to Primary Care (Establish Care)        03/26/23 0219    apixaban (ELIQUIS) 5 MG TABS tablet        03/26/23 0221    HYDROcodone-acetaminophen (NORCO/VICODIN) 5-325 MG tablet  Every 6 hours PRN        03/26/23 0221             Note:  This document was prepared using Dragon voice recognition software and may include unintentional dictation errors.   Loleta Rose, MD 03/26/23 [id number]

## 2023-03-26 NOTE — ED Notes (Signed)
 Provided pt with discharge instructions and education. All of pt questions answered. Pt in possession of all belongings. Pt AAOX4 and stable at time of discharge.Pt ambulated w/ steady gait towards ED exit.

## 2023-03-27 ENCOUNTER — Emergency Department
Admission: EM | Admit: 2023-03-27 | Discharge: 2023-03-27 | Disposition: A | Discharge status: Home / Self Care | Payer: No Typology Code available for payment source | Attending: Emergency Medicine | Admitting: Emergency Medicine

## 2023-03-27 ENCOUNTER — Other Ambulatory Visit: Payer: Self-pay

## 2023-03-27 ENCOUNTER — Emergency Department: Payer: No Typology Code available for payment source

## 2023-03-27 ENCOUNTER — Ambulatory Visit: Payer: Self-pay | Admitting: *Deleted

## 2023-03-27 DIAGNOSIS — R042 Hemoptysis: Secondary | ICD-10-CM | POA: Diagnosis present

## 2023-03-27 DIAGNOSIS — I2693 Single subsegmental pulmonary embolism without acute cor pulmonale: Secondary | ICD-10-CM | POA: Insufficient documentation

## 2023-03-27 LAB — COMPREHENSIVE METABOLIC PANEL
ALT: 17 U/L (ref 0–44)
AST: 16 U/L (ref 15–41)
Albumin: 3.4 g/dL — ABNORMAL LOW (ref 3.5–5.0)
Alkaline Phosphatase: 65 U/L (ref 38–126)
Anion gap: 9 (ref 5–15)
BUN: 6 mg/dL (ref 6–20)
CO2: 25 mmol/L (ref 22–32)
Calcium: 8.9 mg/dL (ref 8.9–10.3)
Chloride: 105 mmol/L (ref 98–111)
Creatinine, Ser: 0.75 mg/dL (ref 0.44–1.00)
GFR, Estimated: >60 mL/min (ref >60)
Glucose, Bld: 94 mg/dL (ref 70–99)
Potassium: 3.7 mmol/L (ref 3.5–5.1)
Sodium: 139 mmol/L (ref 135–145)
Total Bilirubin: 0.9 mg/dL (ref 0.3–1.2)
Total Protein: 7.8 g/dL (ref 6.5–8.1)

## 2023-03-27 LAB — CBC WITH DIFFERENTIAL/PLATELET
Abs Immature Granulocytes: 0.01 10*3/uL (ref 0.00–0.07)
Basophils Absolute: 0 10*3/uL (ref 0.0–0.1)
Basophils Relative: 0 %
Eosinophils Absolute: 0.1 10*3/uL (ref 0.0–0.5)
Eosinophils Relative: 1 %
HCT: 41.9 % (ref 36.0–46.0)
Hemoglobin: 13.1 g/dL (ref 12.0–15.0)
Immature Granulocytes: 0 %
Lymphocytes Relative: 32 %
Lymphs Abs: 2 10*3/uL (ref 0.7–4.0)
MCH: 25.6 pg — ABNORMAL LOW (ref 26.0–34.0)
MCHC: 31.3 g/dL (ref 30.0–36.0)
MCV: 81.8 fL (ref 80.0–100.0)
Monocytes Absolute: 0.7 10*3/uL (ref 0.1–1.0)
Monocytes Relative: 11 %
Neutro Abs: 3.5 10*3/uL (ref 1.7–7.7)
Neutrophils Relative %: 56 %
Platelets: 408 10*3/uL — ABNORMAL HIGH (ref 150–400)
RBC: 5.12 MIL/uL — ABNORMAL HIGH (ref 3.87–5.11)
RDW: 13.2 % (ref 11.5–15.5)
WBC: 6.3 10*3/uL (ref 4.0–10.5)
nRBC: 0 % (ref 0.0–0.2)

## 2023-03-27 LAB — APTT: aPTT: 37 seconds — ABNORMAL HIGH (ref 24–36)

## 2023-03-27 LAB — PROTIME-INR
INR: 1.3 — ABNORMAL HIGH (ref 0.8–1.2)
Prothrombin Time: 16.5 seconds — ABNORMAL HIGH (ref 11.4–15.2)

## 2023-03-27 LAB — TROPONIN I (HIGH SENSITIVITY)
Troponin I (High Sensitivity): 3 ng/L (ref <18)
Troponin I (High Sensitivity): 3 ng/L (ref <18)

## 2023-03-27 MED ORDER — IOHEXOL 350 MG/ML SOLN
75.0000 mL | Freq: Once | INTRAVENOUS | Status: AC | PRN
  Administered 2023-03-27: 75 mL via INTRAVENOUS

## 2023-03-27 NOTE — ED Triage Notes (Signed)
Pt to ED for coughing up blood started this am. Was here on 8/10 and dx with PE. Started on eliquis. Reports new pain to right neck and right shoulder. RR even and unlabored, NAD noted.

## 2023-03-27 NOTE — Discharge Instructions (Addendum)
Continue taking the eliquis as prescribed.  Follow up with your primary care provider.  Return to the ER immediately for new, worsening or chest pain, difficulty breathing, weakness, or any other new or worsening symptoms that concern you. You should also return immediately if you cough up any more blood.

## 2023-03-27 NOTE — ED Notes (Signed)
ED Provider at bedside. 

## 2023-03-27 NOTE — ED Provider Notes (Signed)
[facility] Provider Note    Event Date/Time   First MD Initiated Contact with Patient 03/27/23 1016     (approximate)   History   Hemoptysis   HPI  Donna Buckley is a 22 y.o. female with history of recently diagnosed PE who presents with hemoptysis, acute onset this morning.  The patient states that she had 1 episode in which she coughed up a small amount of mucus mixed with blood.  She had a second episode of coughing in which she coughed up mucus just tinged with a small amount of blood and has not had any further episodes.  She was diagnosed with a PE 2 days ago and started on Eliquis yesterday.  She did take it this morning.  She states that she continues to have the same pain in the middle of her back as she did when she was diagnosed with a PE.  Today she also had some pain going up into the right shoulder although it is improved now.  She denies any chest pain.  She has no shortness of breath.  She does not feel dizzy or lightheaded.  She denies any other abnormal bleeding.  I reviewed the past medical records.  The patient was seen in the ED on 8/10 with back and chest pain.  CT angio showed segmental/subsegmental branching right lower lobe PE with no evidence of right heart strain.  Vital signs were stable and she was discharged with Eliquis.   Physical Exam   Triage Vital Signs: ED Triage Vitals  Encounter Vitals Group     BP 03/27/23 1007 126/83     Systolic BP Percentile --      Diastolic BP Percentile --      Pulse Rate 03/27/23 1007 69     Resp 03/27/23 1007 18     Temp 03/27/23 1007 98.9 F (37.2 C)     Temp src --      SpO2 03/27/23 1007 100 %     Weight 03/27/23 1008 207 lb 3.7 oz (94 kg)     Height 03/27/23 1008 5\' 2"  (1.575 m)     Head Circumference --      Peak Flow --      Pain Score 03/27/23 1008 4     Pain Loc --      Pain Education --      Exclude from Growth Chart --     Most recent vital signs: Vitals:   03/27/23 1330  03/27/23 1406  BP: 120/60 119/76  Pulse: 79 77  Resp: 16 16  Temp:  99.7 F (37.6 C)  SpO2: 97% 100%     General: Awake, well-appearing, no distress.  CV:  Good peripheral perfusion.  Resp:  Normal effort.  Abd:  No distention.  Other:  No edema.   ED Results / Procedures / Treatments   Labs (all labs ordered are listed, but only abnormal results are displayed) Labs Reviewed  COMPREHENSIVE METABOLIC PANEL - Abnormal; Notable for the following components:      Result Value   Albumin 3.4 (*)    All other components within normal limits  CBC WITH DIFFERENTIAL/PLATELET - Abnormal; Notable for the following components:   RBC 5.12 (*)    MCH 25.6 (*)    Platelets 408 (*)    All other components within normal limits  PROTIME-INR - Abnormal; Notable for the following components:   Prothrombin Time 16.5 (*)    INR 1.3 (*)  All other components within normal limits  APTT - Abnormal; Notable for the following components:   aPTT 37 (*)    All other components within normal limits  POC URINE PREG, ED  TROPONIN I (HIGH SENSITIVITY)  TROPONIN I (HIGH SENSITIVITY)     EKG  ED ECG REPORT I, Dionne Bucy, the attending physician, personally viewed and interpreted this ECG.  Date: 03/27/2023 EKG Time: 1032 Rate: 73 Rhythm: normal sinus rhythm QRS Axis: normal Intervals: normal ST/T Wave abnormalities: normal Narrative Interpretation: no evidence of acute ischemia    RADIOLOGY  Chest x-ray: I independently viewed and interpreted the images; there is a right lower lung opacity corresponding to the area of the embolism on CT earlier.  CT angio chest:  IMPRESSION:  1. Unchanged appearance of the acute right lower lobe proximal  segmental and subsegmental branching pulmonary embolus.  2. RV to LV ratio 1.0.  3. Slightly worse rounded subpleural posterior right lower lobe  opacity, suspect combination of acute pulmonary infarct and  surrounding atelectasis.  4.  New trace right effusion, suspect reactive.    PROCEDURES:  Critical Care performed: No  Procedures   MEDICATIONS ORDERED IN ED: Medications  iohexol (OMNIPAQUE) 350 MG/ML injection 75 mL (75 mLs Intravenous Contrast Given 03/27/23 1144)     IMPRESSION / MDM / ASSESSMENT AND PLAN / ED COURSE  I reviewed the triage vital signs and the nursing notes.  22 year old female with PMH as noted above, diagnosed with segmental PE 2 days ago and started on Eliquis presents with small-volume hemoptysis as well as some new right shoulder pain.  On exam the patient is comfortable appearing.  Her vital signs are normal.  There no significant exam findings.  EKG shows no acute abnormalities.  Differential diagnosis includes, but is not limited to, hemoptysis related to the known PE versus less likely new or worsening clot burden.  We will obtain lab workup, chest x-ray, CT angio, and reassess.  Patient's presentation is most consistent with acute presentation with potential threat to life or bodily function.  The patient is on the cardiac monitor to evaluate for evidence of arrhythmia and/or significant heart rate changes.  ----------------------------------------- 1:52 PM on 03/27/2023 -----------------------------------------   Lab workup is unremarkable.  There is no leukocytosis or anemia.  Electrolytes are normal.  Troponin is negative x 2.  CT shows no evidence of new or worsening pulmonary embolism, and does show evidence of an infarct in the area seen on CT previously.  On reassessment, the patient has now been in the ED for almost 4 hours and has not had any recurrence of hemoptysis.  She is feeling well and appears comfortable.  I consulted and discussed the case with Dr. Aundria Rud from pulmonology.  He advises that given the resolved hemoptysis and reassuring CT findings and vital signs, he does not recommend hospitalization or further ED workup.  He advised that the patient should return  for any recurrence of hemoptysis similar to earlier but otherwise should continue Eliquis and follow-up as an outpatient.  I counseled the patient on the results of the workup, pulmonary recommendations, and the follow-up plan and she is in agreement.  I gave her strict return precautions and she expressed understanding.  She is stable for discharge at this time.   FINAL CLINICAL IMPRESSION(S) / ED DIAGNOSES   Final diagnoses:  Hemoptysis  Single subsegmental pulmonary embolism without acute cor pulmonale (HCC)     Rx / DC Orders   ED Discharge Orders  None        Note:  This document was prepared using Dragon voice recognition software and may include unintentional dictation errors.    Dionne Bucy, MD 03/27/23 [phone]

## 2023-03-27 NOTE — Telephone Encounter (Signed)
  Chief Complaint: Hemoptysis Symptoms: Seen in ED Sat. "PE"  This Am coughing up blood, small amount, dark red with clots. Mild pain left shoulder. Frequency: This AM Pertinent Negatives: Patient denies  Disposition: [x] ED /[] Urgent Care (no appt availability in office) / [] Appointment(In office/virtual)/ []  [facility]/ [] Home Care/ [] Refused Recommended Disposition /[] [facility]/ []  Follow-up with PCP Additional Notes: Community line call. Advised ED, states will follow disposition. Reason for Disposition  History of prior "blood clot" in leg or lungs (i.e., deep vein thrombosis, pulmonary embolism)    HAs PE  Answer Assessment - Initial Assessment Questions 1. ONSET: "When did the cough begin?"      This AM 2. SEVERITY: "How bad is the cough today?" "Did the blood appear after a coughing spell?"      NA 3. SPUTUM: "Describe the color of your sputum" (none, dry cough; clear, white, yellow, green)     NA 4. HEMOPTYSIS: "How much blood?" (flecks, streaks, tablespoons, etc.)     Dark red, clots 5. DIFFICULTY BREATHING: "Are you having difficulty breathing?" If Yes, ask: "How bad is it?" (e.g., mild, moderate, severe)    - MILD: No SOB at rest, mild SOB with walking, speaks normally in sentences, can lie down, no retractions, pulse < 100.    - MODERATE: SOB at rest, SOB with minimal exertion and prefers to sit, cannot lie down flat, speaks in phrases, mild retractions, audible wheezing, pulse 100-120.    - SEVERE: Very SOB at rest, speaks in single words, struggling to breathe, sitting hunched forward, retractions, pulse > 120      Mild 6. FEVER: "Do you have a fever?" If Yes, ask: "What is your temperature, how was it measured, and when did it start?"     no 7. CARDIAC HISTORY: "Do you have any history of heart disease?" (e.g., heart attack, congestive heart failure)       8. LUNG HISTORY: "Do you have any history of lung disease?"  (e.g., pulmonary embolus,  asthma, emphysema)      9. PE RISK FACTORS: "Do you have a history of blood clots?" (or: recent major surgery, recent prolonged travel, bedridden)     Yes 10. OTHER SYMPTOMS: "Do you have any other symptoms?" (e.g., runny nose, wheezing, chest pain)       Right shoulder pain  Protocols used: Coughing Up Blood-A-AH

## 2023-03-28 ENCOUNTER — Inpatient Hospital Stay: Payer: No Typology Code available for payment source

## 2023-03-28 ENCOUNTER — Encounter: Payer: Self-pay | Admitting: Internal Medicine

## 2023-03-28 ENCOUNTER — Inpatient Hospital Stay: Payer: No Typology Code available for payment source | Attending: Internal Medicine | Admitting: Internal Medicine

## 2023-03-28 VITALS — BP 123/89 | HR 76 | Temp 98.0°F | Resp 18 | Ht 62.0 in | Wt 210.1 lb

## 2023-03-28 DIAGNOSIS — I2693 Single subsegmental pulmonary embolism without acute cor pulmonale: Secondary | ICD-10-CM | POA: Insufficient documentation

## 2023-03-28 DIAGNOSIS — Z7901 Long term (current) use of anticoagulants: Secondary | ICD-10-CM | POA: Insufficient documentation

## 2023-03-28 DIAGNOSIS — I2699 Other pulmonary embolism without acute cor pulmonale: Secondary | ICD-10-CM | POA: Insufficient documentation

## 2023-03-28 LAB — ANTITHROMBIN III: AntiThromb III Func: 97 % (ref 75–120)

## 2023-03-28 LAB — D-DIMER, QUANTITATIVE: D-Dimer, Quant: 1.81 ug/mL-FEU — ABNORMAL HIGH (ref 0.00–0.50)

## 2023-03-28 NOTE — Progress Notes (Signed)
[facility]  [phone] [phone]) [phone]  ID: Donna Buckley OB: 01/14/2001  MR#: [id number]  VPX#:[id number]  Patient Care Team: Pcp, No as PCP - General  REFERRING PROVIDER: Dr. York Cerise  REASON FOR REFERRAL: Acute right segmental PE  HPI: Donna Buckley is a 22 y.o. female with no significant past medical history was referred to hematology for management of acute right segmental PE.  Patient was having some right flank pain with inspiration since Monday but it significantly worsened on 8/924.  She presented to ED the next day. CTA showed segmental and subsegmental right lower lobe pulmonary embolism, associated right lower lobe pulmonary infarct.  She was started on Eliquis.  On 03/27/2023 she had 1 episode of hemoptysis and went back to ED. CTA was repeated which was unchanged from 2 days ago.  Patient denies any personal prior history of blood clot.  Reports family history of blood clot in maternal and which happened 3 times and and great grand dad.  Patient was using Sprintec OCP in 2019 had complication with excessive bleeding for 2 months and left flank pain.  Had son.  And then resumed OCP in June 2021.  She was taking norethindrone and ethinyl estradiol 1 mg - 0.02 mg.  She has stopped it since diagnosed with PE.   REVIEW OF SYSTEMS:   ROS  As per HPI. Otherwise, a complete review of systems is negative.  PAST MEDICAL HISTORY: Past Medical History:  Diagnosis Date   Anxiety    Clotting disorder (HCC)    Depression    Medical history non-contributory     PAST SURGICAL HISTORY: Past Surgical History:  Procedure Laterality Date   CESAREAN SECTION N/A 04/19/2020   Procedure: CESAREAN SECTION;  Surgeon: Vena Austria, MD;  Location: [facility] ORS;  Service: Obstetrics;  Laterality: N/A;   CHOLECYSTECTOMY     NO PAST SURGERIES      FAMILY HISTORY: Family History  Problem Relation Age of Onset   Healthy Mother    Healthy Father     Clotting disorder Maternal Aunt    Clotting disorder Other     HEALTH MAINTENANCE: Social History   Tobacco Use   Smoking status: Never   Smokeless tobacco: Never  Vaping Use   Vaping status: Never Used  Substance Use Topics   Alcohol use: Not Currently   Drug use: Never     No Known Allergies  Current Outpatient Medications  Medication Sig Dispense Refill   apixaban (ELIQUIS) 5 MG TABS tablet Take 2 tablets (10 mg) PO BID x 7 days, then reduce dose to 1 tablet (5 mg) PO BID. 60 tablet 2   HYDROcodone-acetaminophen (NORCO/VICODIN) 5-325 MG tablet Take 2 tablets by mouth every 6 (six) hours as needed for moderate pain or severe pain. 16 tablet 0   JUNEL 1/20 1-20 MG-MCG tablet Take 1 tablet by mouth daily. (Patient not taking: Reported on 03/28/2023)     No current facility-administered medications for this visit.    OBJECTIVE: Vitals:   03/28/23 1121  BP: 123/89  Pulse: 76  Resp: 18  Temp: 98 F (36.7 C)  SpO2: 97%     Body mass index is 38.43 kg/m.      General: Well-developed, well-nourished, no acute distress. Eyes: Pink conjunctiva, anicteric sclera. HEENT: Normocephalic, moist mucous membranes, clear oropharnyx. Lungs: Clear to auscultation bilaterally. Heart: Regular rate and rhythm. No rubs, murmurs, or gallops. Abdomen: Soft, nontender, nondistended. No organomegaly noted, normoactive bowel sounds. Musculoskeletal: No edema, cyanosis, or  clubbing. Neuro: Alert, answering all questions appropriately. Cranial nerves grossly intact. Skin: No rashes or petechiae noted. Psych: Normal affect. Lymphatics: No cervical, calvicular, axillary or inguinal LAD.   LAB RESULTS:  Lab Results  Component Value Date   NA 139 03/27/2023   K 3.7 03/27/2023   CL 105 03/27/2023   CO2 25 03/27/2023   GLUCOSE 94 03/27/2023   BUN 6 03/27/2023   CREATININE 0.75 03/27/2023   CALCIUM 8.9 03/27/2023   PROT 7.8 03/27/2023   ALBUMIN 3.4 (L) 03/27/2023   AST 16 03/27/2023    ALT 17 03/27/2023   ALKPHOS 65 03/27/2023   BILITOT 0.9 03/27/2023   GFRNONAA >60 03/27/2023   GFRAA >60 04/19/2020    Lab Results  Component Value Date   WBC 6.3 03/27/2023   NEUTROABS 3.5 03/27/2023   HGB 13.1 03/27/2023   HCT 41.9 03/27/2023   MCV 81.8 03/27/2023   PLT 408 (H) 03/27/2023    No results found for: "TIBC", "FERRITIN", "IRONPCTSAT"   STUDIES: CT Angio Chest Pulmonary Embolism (PE) W or WO Contrast  Result Date: 03/27/2023 CLINICAL DATA:  Known acute right lower lobe pulmonary embolus 2 days ago, continued mild persist, worsening chest pain EXAM: CT ANGIOGRAPHY CHEST WITH CONTRAST TECHNIQUE: Multidetector CT imaging of the chest was performed using the standard protocol during bolus administration of intravenous contrast. Multiplanar CT image reconstructions and MIPs were obtained to evaluate the vascular anatomy. RADIATION DOSE REDUCTION: This exam was performed according to the departmental dose-optimization program which includes automated exposure control, adjustment of the mA and/or kV according to patient size and/or use of iterative reconstruction technique. CONTRAST:  75mL OMNIPAQUE IOHEXOL 350 MG/ML SOLN COMPARISON:  03/26/2023 FINDINGS: Cardiovascular: Unchanged appearance of the acute right lower lobe proximal segmental and subsegmental branching pulmonary embolus. RV to LV ratio 1.0. Overall prominent heart size. No pericardial effusion. Intact thoracic aorta. No aneurysm or dissection. Patent 3 vessel arch anatomy. No mediastinal hemorrhage or hematoma. Central venous structures are patent. No veno-occlusive finding. No anomalous pulmonary venous return. Mediastinum/Nodes: No enlarged mediastinal, hilar, or axillary lymph nodes. Thyroid gland, trachea, and esophagus demonstrate no significant findings. Lungs/Pleura: Slightly worse rounded subpleural posterior right lower lobe opacity, suspect combination acute pulmonary infarct and surrounding atelectasis. Left lung  remains clear.  Central airways are patent. New trace right effusion, suspect reactive. No additional pleural abnormality or pneumothorax. Upper Abdomen: No acute abnormality. Musculoskeletal: No chest wall abnormality. No acute or significant osseous findings. Review of the MIP images confirms the above findings. IMPRESSION: 1. Unchanged appearance of the acute right lower lobe proximal segmental and subsegmental branching pulmonary embolus. 2. RV to LV ratio 1.0. 3. Slightly worse rounded subpleural posterior right lower lobe opacity, suspect combination of acute pulmonary infarct and surrounding atelectasis. 4. New trace right effusion, suspect reactive. Electronically Signed   By: Judie Petit.  Shick M.D.   On: 03/27/2023 12:20   DG Chest Port 1 View  Result Date: 03/27/2023 CLINICAL DATA:  Hemoptysis EXAM: PORTABLE CHEST 1 VIEW COMPARISON:  CT done on 03/26/2023 FINDINGS: Cardiac size is within normal limits. There are no signs of pulmonary edema. There is subtle increased density in the lateral aspect of right lower lung field corresponding to the pleural-based density seen in the previous CT. There are no new focal infiltrates. There is no pleural effusion or pneumothorax. IMPRESSION: Subtle increased density in the lateral aspect of right lower lung field corresponds to pleural-based infiltrate/infarct seen in the previous CT. There are no new infiltrates or  signs of pulmonary edema. Electronically Signed   By: Ernie Avena M.D.   On: 03/27/2023 11:00   CT Angio Chest PE W/Cm &/Or Wo Cm  Result Date: 03/26/2023 CLINICAL DATA:  Suspected right lower lobe pulmonary infarct on CT abdomen/pelvis. Evaluate for PE. EXAM: CT ANGIOGRAPHY CHEST WITH CONTRAST TECHNIQUE: Multidetector CT imaging of the chest was performed using the standard protocol during bolus administration of intravenous contrast. Multiplanar CT image reconstructions and MIPs were obtained to evaluate the vascular anatomy. RADIATION DOSE  REDUCTION: This exam was performed according to the departmental dose-optimization program which includes automated exposure control, adjustment of the mA and/or kV according to patient size and/or use of iterative reconstruction technique. CONTRAST:  75mL OMNIPAQUE IOHEXOL 350 MG/ML SOLN COMPARISON:  None Available. FINDINGS: Cardiovascular: Satisfactory opacification the bilateral pulmonary arteries to the segmental level. Associated segmental/subsegmental branching right lower lobe pulmonary embolus (series 7/images 203 and 231). Although not tailored for evaluation of the thoracic aorta, there is no evidence of thoracic aortic aneurysm or dissection. The heart is normal in size. No pericardial effusion. No findings suspicious for right heart strain. Mediastinum/Nodes: No suspicious mediastinal lymphadenopathy. Visualized thyroid is unremarkable. Lungs/Pleura: Rounded subpleural posterior right lower lobe opacity (series 6/image 90), as noted on CT abdomen/pelvis, suspicious for pulmonary infarct. No focal consolidation. No suspicious pulmonary nodules. No pleural effusion or pneumothorax. Upper Abdomen: Visualized upper abdomen is grossly unremarkable. Musculoskeletal: No abdominopelvic ascites. Review of the MIP images confirms the above findings. IMPRESSION: Segmental/subsegmental branching right lower lobe pulmonary embolus, as above. No findings suspicious for right heart strain. Associated right lower lobe pulmonary infarct. Critical Value/emergent results were called by telephone at the time of interpretation on 03/26/2023 at 1:35 am to provider [facility] , who verbally acknowledged these results. Electronically Signed   By: Charline Bills M.D.   On: 03/26/2023 01:37   CT Renal Stone Study  Addendum Date: 03/26/2023   ADDENDUM REPORT: 03/26/2023 00:54 ADDENDUM: These results were called by telephone at the time of interpretation on 03/26/2023 at 12:53 am to provider [facility] , who verbally  acknowledged these results. Electronically Signed   By: Tish Frederickson M.D.   On: 03/26/2023 00:54   Result Date: 03/26/2023 CLINICAL DATA:  Abdominal/flank pain, stone suspected C/O right thoracic back pain x 1 week. Pain with deep breathing and movement. Denies injury EXAM: CT ABDOMEN AND PELVIS WITHOUT CONTRAST TECHNIQUE: Multidetector CT imaging of the abdomen and pelvis was performed following the standard protocol without IV contrast. RADIATION DOSE REDUCTION: This exam was performed according to the departmental dose-optimization program which includes automated exposure control, adjustment of the mA and/or kV according to patient size and/or use of iterative reconstruction technique. COMPARISON:  MRI abdomen 06/16/2020 FINDINGS: Lower chest: Right lower lobe wedge-shaped peripheral consolidation with surrounding ground-glass airspace opacity. Hepatobiliary: No focal liver abnormality. Status post cholecystectomy. No biliary dilatation. Pancreas: No focal lesion. Normal pancreatic contour. No surrounding inflammatory changes. No main pancreatic ductal dilatation. Spleen: Normal in size without focal abnormality. Adrenals/Urinary Tract: No adrenal nodule bilaterally. No nephrolithiasis and no hydronephrosis. No definite contour-deforming renal mass. No ureterolithiasis or hydroureter. The urinary bladder is unremarkable. Stomach/Bowel: Stomach is within normal limits. No evidence of bowel wall thickening or dilatation. Appendix appears normal. Vascular/Lymphatic: No abdominal aorta or iliac aneurysm. No abdominal, pelvic, or inguinal lymphadenopathy. Reproductive: Uterus and bilateral adnexa are unremarkable. Other: No intraperitoneal free fluid. No intraperitoneal free gas. No organized fluid collection. Musculoskeletal: No abdominal wall hernia or abnormality. No suspicious  lytic or blastic osseous lesions. No acute displaced fracture. IMPRESSION: 1. Right lower lobe wedge-shaped peripheral consolidation  with surround ground-glass airspace opacity. Finding concerning for pulmonary infarction. Differential diagnosis includes infection. Recommend CT PA for further evaluation of possible pulmonary embolus. 2. No acute intra-abdominal or intrapelvic abnormality with limited evaluation on this noncontrast study. Electronically Signed: By: Tish Frederickson M.D. On: 03/26/2023 00:47    ASSESSMENT AND PLAN:   Kristain Garrison is a 22 y.o. female with no significant past medical history was referred to hematology for management of acute right segmental PE.  # Acute right segmental/subsegmental PE -Diagnosed 03/26/2023.  Likely provoked by estrogen containing OCP.  Patient has stopped taking it since the diagnosis. -She is on Eliquis 10 mg twice daily for 7 days and will then transition to 5 mg twice daily. -Had 1 episode of hemoptysis yesterday and presented to ER.  Repeat CTA was unchanged.  Likely related to pulmonary infarct.  Patient was advised to monitor and if any worsening to inform the provider. -She reports family history of blood clot.  I will consider doing hypercoagulable panel in her case.  At times, there could be undiagnosed hereditary condition which with additional risk factors such as OCP in her case may have precipitated the PE. -We discussed about duration of anticoagulation.  If her hypercoag panel comes back negative, I will consider 3 to 6 months.  I will also obtain D-dimer level for baseline.  Repeat in 3 months and if negative would consider stopping anticoagulation.  Orders Placed This Encounter  Procedures   Antithrombin III   Protein C activity   Protein C, total   Protein S activity   Protein S, total   Beta-2-glycoprotein i abs, IgG/M/A   Homocysteine, serum   Factor 5 leiden   Prothrombin gene mutation   Cardiolipin antibodies, IgG, IgM, IgA   Hexagonal Phase Phospholipid   D-dimer, quantitative   RTC in 2 weeks via video visit to discuss labs.  Patient expressed  understanding and was in agreement with this plan. She also understands that She can call clinic at any time with any questions, concerns, or complaints.   I spent a total of 45 minutes reviewing chart data, face-to-face evaluation with the patient, counseling and coordination of care as detailed above.  Michaelyn Barter, MD   03/28/2023 11:43 AM

## 2023-03-28 NOTE — Progress Notes (Signed)
Patient would like to speak about birth control options.

## 2023-04-11 ENCOUNTER — Inpatient Hospital Stay: Payer: No Typology Code available for payment source | Admitting: Internal Medicine

## 2023-04-11 ENCOUNTER — Encounter: Payer: Self-pay | Admitting: Internal Medicine

## 2023-04-11 DIAGNOSIS — I2699 Other pulmonary embolism without acute cor pulmonale: Secondary | ICD-10-CM

## 2023-04-11 NOTE — Progress Notes (Signed)
Patient states that she gets a metallic taste in mouth and wants to know if that is normal

## 2023-04-13 NOTE — Progress Notes (Signed)
[facility]  [phone] [phone]  I connected with Donna Buckley on 04/13/23 at  3:15 PM EDT by my chart video and verified that I am speaking with the correct person using two identifiers.   I discussed the limitations, risks, security and privacy concerns of performing an evaluation and management service by telemedicine and the availability of in-person appointments. I also discussed with the patient that there may be a patient responsible charge related to this service. The patient expressed understanding and agreed to proceed.   Other persons participating in the visit and their role in the encounter: none   Patient's location: car  Provider's location: clinic   Chief Complaint: discuss labs   ID: Donna Buckley OB: 03/28/2001  MR#: [id number]  QIO#:[id number]  Patient Care Team: Pcp, No as PCP - General Michaelyn Barter, MD as Consulting Physician (Oncology)  REFERRING PROVIDER: Dr. York Cerise  REASON FOR REFERRAL: Acute right segmental PE  HPI: Donna Buckley is a 22 y.o. female with no significant past medical history was referred to hematology for management of acute right segmental PE.  Patient was having some right flank pain with inspiration since Monday but it significantly worsened on 8/924.  She presented to ED the next day. CTA showed segmental and subsegmental right lower lobe pulmonary embolism, associated right lower lobe pulmonary infarct.  She was started on Eliquis.  On 03/27/2023 she had 1 episode of hemoptysis and went back to ED. CTA was repeated which was unchanged from 2 days ago.  Patient denies any personal prior history of blood clot.  Reports family history of blood clot in maternal and which happened 3 times and and great grand dad.  Patient was using Sprintec OCP in 2019 had complication with excessive bleeding for 2 months and left flank pain.  Had son.  And then resumed OCP in June 2021.  She was taking norethindrone and  ethinyl estradiol 1 mg - 0.02 mg.  She has stopped it since diagnosed with PE.  Interval history Connected with the patient via MyChart video visit to discuss labs She has been doing well on Eliquis.  Has some metallic taste which is very transient.  Does not bother her.  Cough with mild blood streaking has resolved.  Denies any bleeding.  REVIEW OF SYSTEMS:   ROS  As per HPI. Otherwise, a complete review of systems is negative.  PAST MEDICAL HISTORY: Past Medical History:  Diagnosis Date   Anxiety    Clotting disorder (HCC)    Depression    Medical history non-contributory     PAST SURGICAL HISTORY: Past Surgical History:  Procedure Laterality Date   CESAREAN SECTION N/A 04/19/2020   Procedure: CESAREAN SECTION;  Surgeon: Vena Austria, MD;  Location: [facility] ORS;  Service: Obstetrics;  Laterality: N/A;   CHOLECYSTECTOMY     NO PAST SURGERIES      FAMILY HISTORY: Family History  Problem Relation Age of Onset   Healthy Mother    Healthy Father    Clotting disorder Maternal Aunt    Clotting disorder Other     HEALTH MAINTENANCE: Social History   Tobacco Use   Smoking status: Never   Smokeless tobacco: Never  Vaping Use   Vaping status: Never Used  Substance Use Topics   Alcohol use: Not Currently   Drug use: Never     No Known Allergies  Current Outpatient Medications  Medication Sig Dispense Refill   apixaban (ELIQUIS) 5 MG TABS tablet Take 2 tablets (10  mg) PO BID x 7 days, then reduce dose to 1 tablet (5 mg) PO BID. 60 tablet 2   HYDROcodone-acetaminophen (NORCO/VICODIN) 5-325 MG tablet Take 2 tablets by mouth every 6 (six) hours as needed for moderate pain or severe pain. (Patient not taking: Reported on 04/11/2023) 16 tablet 0   JUNEL 1/20 1-20 MG-MCG tablet Take 1 tablet by mouth daily. (Patient not taking: Reported on 03/28/2023)     No current facility-administered medications for this visit.    OBJECTIVE: There were no vitals filed for this  visit.    There is no height or weight on file to calculate BMI.      Physical exam not performed.  Virtual visit.  LAB RESULTS:  Lab Results  Component Value Date   NA 139 03/27/2023   K 3.7 03/27/2023   CL 105 03/27/2023   CO2 25 03/27/2023   GLUCOSE 94 03/27/2023   BUN 6 03/27/2023   CREATININE 0.75 03/27/2023   CALCIUM 8.9 03/27/2023   PROT 7.8 03/27/2023   ALBUMIN 3.4 (L) 03/27/2023   AST 16 03/27/2023   ALT 17 03/27/2023   ALKPHOS 65 03/27/2023   BILITOT 0.9 03/27/2023   GFRNONAA >60 03/27/2023   GFRAA >60 04/19/2020    Lab Results  Component Value Date   WBC 6.3 03/27/2023   NEUTROABS 3.5 03/27/2023   HGB 13.1 03/27/2023   HCT 41.9 03/27/2023   MCV 81.8 03/27/2023   PLT 408 (H) 03/27/2023    No results found for: "TIBC", "FERRITIN", "IRONPCTSAT"   STUDIES: CT Angio Chest Pulmonary Embolism (PE) W or WO Contrast  Result Date: 03/27/2023 CLINICAL DATA:  Known acute right lower lobe pulmonary embolus 2 days ago, continued mild persist, worsening chest pain EXAM: CT ANGIOGRAPHY CHEST WITH CONTRAST TECHNIQUE: Multidetector CT imaging of the chest was performed using the standard protocol during bolus administration of intravenous contrast. Multiplanar CT image reconstructions and MIPs were obtained to evaluate the vascular anatomy. RADIATION DOSE REDUCTION: This exam was performed according to the departmental dose-optimization program which includes automated exposure control, adjustment of the mA and/or kV according to patient size and/or use of iterative reconstruction technique. CONTRAST:  75mL OMNIPAQUE IOHEXOL 350 MG/ML SOLN COMPARISON:  03/26/2023 FINDINGS: Cardiovascular: Unchanged appearance of the acute right lower lobe proximal segmental and subsegmental branching pulmonary embolus. RV to LV ratio 1.0. Overall prominent heart size. No pericardial effusion. Intact thoracic aorta. No aneurysm or dissection. Patent 3 vessel arch anatomy. No mediastinal hemorrhage  or hematoma. Central venous structures are patent. No veno-occlusive finding. No anomalous pulmonary venous return. Mediastinum/Nodes: No enlarged mediastinal, hilar, or axillary lymph nodes. Thyroid gland, trachea, and esophagus demonstrate no significant findings. Lungs/Pleura: Slightly worse rounded subpleural posterior right lower lobe opacity, suspect combination acute pulmonary infarct and surrounding atelectasis. Left lung remains clear.  Central airways are patent. New trace right effusion, suspect reactive. No additional pleural abnormality or pneumothorax. Upper Abdomen: No acute abnormality. Musculoskeletal: No chest wall abnormality. No acute or significant osseous findings. Review of the MIP images confirms the above findings. IMPRESSION: 1. Unchanged appearance of the acute right lower lobe proximal segmental and subsegmental branching pulmonary embolus. 2. RV to LV ratio 1.0. 3. Slightly worse rounded subpleural posterior right lower lobe opacity, suspect combination of acute pulmonary infarct and surrounding atelectasis. 4. New trace right effusion, suspect reactive. Electronically Signed   By: Judie Petit.  Shick M.D.   On: 03/27/2023 12:20   DG Chest Port 1 View  Result Date: 03/27/2023 CLINICAL DATA:  Hemoptysis EXAM: PORTABLE CHEST 1 VIEW COMPARISON:  CT done on 03/26/2023 FINDINGS: Cardiac size is within normal limits. There are no signs of pulmonary edema. There is subtle increased density in the lateral aspect of right lower lung field corresponding to the pleural-based density seen in the previous CT. There are no new focal infiltrates. There is no pleural effusion or pneumothorax. IMPRESSION: Subtle increased density in the lateral aspect of right lower lung field corresponds to pleural-based infiltrate/infarct seen in the previous CT. There are no new infiltrates or signs of pulmonary edema. Electronically Signed   By: Ernie Avena M.D.   On: 03/27/2023 11:00   CT Angio Chest PE W/Cm  &/Or Wo Cm  Result Date: 03/26/2023 CLINICAL DATA:  Suspected right lower lobe pulmonary infarct on CT abdomen/pelvis. Evaluate for PE. EXAM: CT ANGIOGRAPHY CHEST WITH CONTRAST TECHNIQUE: Multidetector CT imaging of the chest was performed using the standard protocol during bolus administration of intravenous contrast. Multiplanar CT image reconstructions and MIPs were obtained to evaluate the vascular anatomy. RADIATION DOSE REDUCTION: This exam was performed according to the departmental dose-optimization program which includes automated exposure control, adjustment of the mA and/or kV according to patient size and/or use of iterative reconstruction technique. CONTRAST:  75mL OMNIPAQUE IOHEXOL 350 MG/ML SOLN COMPARISON:  None Available. FINDINGS: Cardiovascular: Satisfactory opacification the bilateral pulmonary arteries to the segmental level. Associated segmental/subsegmental branching right lower lobe pulmonary embolus (series 7/images 203 and 231). Although not tailored for evaluation of the thoracic aorta, there is no evidence of thoracic aortic aneurysm or dissection. The heart is normal in size. No pericardial effusion. No findings suspicious for right heart strain. Mediastinum/Nodes: No suspicious mediastinal lymphadenopathy. Visualized thyroid is unremarkable. Lungs/Pleura: Rounded subpleural posterior right lower lobe opacity (series 6/image 90), as noted on CT abdomen/pelvis, suspicious for pulmonary infarct. No focal consolidation. No suspicious pulmonary nodules. No pleural effusion or pneumothorax. Upper Abdomen: Visualized upper abdomen is grossly unremarkable. Musculoskeletal: No abdominopelvic ascites. Review of the MIP images confirms the above findings. IMPRESSION: Segmental/subsegmental branching right lower lobe pulmonary embolus, as above. No findings suspicious for right heart strain. Associated right lower lobe pulmonary infarct. Critical Value/emergent results were called by telephone at  the time of interpretation on 03/26/2023 at 1:35 am to provider [facility] , who verbally acknowledged these results. Electronically Signed   By: Charline Bills M.D.   On: 03/26/2023 01:37   CT Renal Stone Study  Addendum Date: 03/26/2023   ADDENDUM REPORT: 03/26/2023 00:54 ADDENDUM: These results were called by telephone at the time of interpretation on 03/26/2023 at 12:53 am to provider [facility] , who verbally acknowledged these results. Electronically Signed   By: Tish Frederickson M.D.   On: 03/26/2023 00:54   Result Date: 03/26/2023 CLINICAL DATA:  Abdominal/flank pain, stone suspected C/O right thoracic back pain x 1 week. Pain with deep breathing and movement. Denies injury EXAM: CT ABDOMEN AND PELVIS WITHOUT CONTRAST TECHNIQUE: Multidetector CT imaging of the abdomen and pelvis was performed following the standard protocol without IV contrast. RADIATION DOSE REDUCTION: This exam was performed according to the departmental dose-optimization program which includes automated exposure control, adjustment of the mA and/or kV according to patient size and/or use of iterative reconstruction technique. COMPARISON:  MRI abdomen 06/16/2020 FINDINGS: Lower chest: Right lower lobe wedge-shaped peripheral consolidation with surrounding ground-glass airspace opacity. Hepatobiliary: No focal liver abnormality. Status post cholecystectomy. No biliary dilatation. Pancreas: No focal lesion. Normal pancreatic contour. No surrounding inflammatory changes. No main pancreatic ductal dilatation.  Spleen: Normal in size without focal abnormality. Adrenals/Urinary Tract: No adrenal nodule bilaterally. No nephrolithiasis and no hydronephrosis. No definite contour-deforming renal mass. No ureterolithiasis or hydroureter. The urinary bladder is unremarkable. Stomach/Bowel: Stomach is within normal limits. No evidence of bowel wall thickening or dilatation. Appendix appears normal. Vascular/Lymphatic: No abdominal aorta or  iliac aneurysm. No abdominal, pelvic, or inguinal lymphadenopathy. Reproductive: Uterus and bilateral adnexa are unremarkable. Other: No intraperitoneal free fluid. No intraperitoneal free gas. No organized fluid collection. Musculoskeletal: No abdominal wall hernia or abnormality. No suspicious lytic or blastic osseous lesions. No acute displaced fracture. IMPRESSION: 1. Right lower lobe wedge-shaped peripheral consolidation with surround ground-glass airspace opacity. Finding concerning for pulmonary infarction. Differential diagnosis includes infection. Recommend CT PA for further evaluation of possible pulmonary embolus. 2. No acute intra-abdominal or intrapelvic abnormality with limited evaluation on this noncontrast study. Electronically Signed: By: Tish Frederickson M.D. On: 03/26/2023 00:47    ASSESSMENT AND PLAN:   Jolee Searer is a 22 y.o. female with no significant past medical history was referred to hematology for management of acute right segmental PE.  # Acute right segmental/subsegmental PE -Diagnosed 03/26/2023.  Likely provoked by estrogen containing OCP.  Patient has stopped taking it since the diagnosis. -Hypercoagulable panel was negative.  D-dimer on diagnosis 1.8. -Continue with Eliquis 5 mg twice daily.  Patient reports compliance.  Recommended duration is 3 to 6 months.  I will recheck D-dimer level at 76-month mark.  If it is negative, I will consider stopping anticoagulation.  Orders Placed This Encounter  Procedures   CBC with Differential/Platelet   Comprehensive metabolic panel   D-dimer, quantitative   RTC in 3 months for video visit.  Labs 2 days prior to visit.  Patient expressed understanding and was in agreement with this plan. She also understands that She can call clinic at any time with any questions, concerns, or complaints.   I spent a total of 30 minutes reviewing chart data, face-to-face evaluation with the patient, counseling and coordination of care as  detailed above.  Michaelyn Barter, MD   04/13/2023 5:23 PM

## 2023-06-20 ENCOUNTER — Inpatient Hospital Stay: Payer: No Typology Code available for payment source | Attending: Internal Medicine

## 2023-06-20 DIAGNOSIS — I2693 Single subsegmental pulmonary embolism without acute cor pulmonale: Secondary | ICD-10-CM | POA: Insufficient documentation

## 2023-06-20 DIAGNOSIS — I2699 Other pulmonary embolism without acute cor pulmonale: Secondary | ICD-10-CM

## 2023-06-20 DIAGNOSIS — Z7901 Long term (current) use of anticoagulants: Secondary | ICD-10-CM | POA: Diagnosis not present

## 2023-06-20 LAB — COMPREHENSIVE METABOLIC PANEL
ALT: 20 U/L (ref 0–44)
AST: 18 U/L (ref 15–41)
Albumin: 3.6 g/dL (ref 3.5–5.0)
Alkaline Phosphatase: 73 U/L (ref 38–126)
Anion gap: 7 (ref 5–15)
BUN: 11 mg/dL (ref 6–20)
CO2: 24 mmol/L (ref 22–32)
Calcium: 8.8 mg/dL — ABNORMAL LOW (ref 8.9–10.3)
Chloride: 105 mmol/L (ref 98–111)
Creatinine, Ser: 0.87 mg/dL (ref 0.44–1.00)
GFR, Estimated: >60 mL/min (ref >60)
Glucose, Bld: 88 mg/dL (ref 70–99)
Potassium: 4.4 mmol/L (ref 3.5–5.1)
Sodium: 136 mmol/L (ref 135–145)
Total Bilirubin: 0.5 mg/dL (ref <1.2)
Total Protein: 7.9 g/dL (ref 6.5–8.1)

## 2023-06-20 LAB — CBC WITH DIFFERENTIAL/PLATELET
Abs Immature Granulocytes: 0.01 10*3/uL (ref 0.00–0.07)
Basophils Absolute: 0 10*3/uL (ref 0.0–0.1)
Basophils Relative: 1 %
Eosinophils Absolute: 0.1 10*3/uL (ref 0.0–0.5)
Eosinophils Relative: 1 %
HCT: 41.6 % (ref 36.0–46.0)
Hemoglobin: 13.3 g/dL (ref 12.0–15.0)
Immature Granulocytes: 0 %
Lymphocytes Relative: 38 %
Lymphs Abs: 1.7 10*3/uL (ref 0.7–4.0)
MCH: 25.6 pg — ABNORMAL LOW (ref 26.0–34.0)
MCHC: 32 g/dL (ref 30.0–36.0)
MCV: 80.2 fL (ref 80.0–100.0)
Monocytes Absolute: 0.4 10*3/uL (ref 0.1–1.0)
Monocytes Relative: 9 %
Neutro Abs: 2.3 10*3/uL (ref 1.7–7.7)
Neutrophils Relative %: 51 %
Platelets: 394 10*3/uL (ref 150–400)
RBC: 5.19 MIL/uL — ABNORMAL HIGH (ref 3.87–5.11)
RDW: 14.1 % (ref 11.5–15.5)
WBC: 4.5 10*3/uL (ref 4.0–10.5)
nRBC: 0 % (ref 0.0–0.2)

## 2023-06-20 LAB — D-DIMER, QUANTITATIVE: D-Dimer, Quant: 0.28 ug{FEU}/mL (ref 0.00–0.50)

## 2023-06-27 ENCOUNTER — Inpatient Hospital Stay: Payer: No Typology Code available for payment source | Admitting: Internal Medicine

## 2023-06-27 DIAGNOSIS — I2699 Other pulmonary embolism without acute cor pulmonale: Secondary | ICD-10-CM

## 2023-06-27 NOTE — Progress Notes (Signed)
Patient is ready for her my chart visit, she states that she is still having some discomfort from time to time in the chest area.

## 2023-06-27 NOTE — Progress Notes (Signed)
[name]  [phone] [phone]  I connected with Donna Buckley on 06/27/23 at  3:30 PM EST by my chart video and verified that I am speaking with the correct person using two identifiers.   I discussed the limitations, risks, security and privacy concerns of performing an evaluation and management service by telemedicine and the availability of in-person appointments. I also discussed with the patient that there may be a patient responsible charge related to this service. The patient expressed understanding and agreed to proceed.   Other persons participating in the visit and their role in the encounter: none   Patient's location: home Provider's location: clinic   Chief Complaint: discuss labs   ID: Donna Buckley OB: [birth year]  MR#: [id number]  AOZ#:[id number]  Patient Care Team: Pcp, No as PCP - General Michaelyn Barter, MD as Consulting Physician (Oncology)  REFERRING PROVIDER: Dr. York Cerise  REASON FOR REFERRAL: Acute right segmental PE  HPI: Donna Buckley is a 22 y.o. female with no significant past medical history was referred to hematology for management of acute right segmental PE.  Patient was having some right flank pain with inspiration since Monday but it significantly worsened on 8/924.  She presented to ED the next day. CTA showed segmental and subsegmental right lower lobe pulmonary embolism, associated right lower lobe pulmonary infarct.  She was started on Eliquis.  On 03/27/2023 she had 1 episode of hemoptysis and went back to ED. CTA was repeated which was unchanged from 2 days ago.  Patient denies any personal prior history of blood clot.  Reports family history of blood clot in maternal and which happened 3 times and and great grand dad.  Patient was using Sprintec OCP in 2019 had complication with excessive bleeding for 2 months and left flank pain.  Had son.  And then resumed OCP in June 2021.  She was taking norethindrone and  ethinyl estradiol 1 mg - 0.02 mg.  She has stopped it since diagnosed with PE.  Interval history Connected with the patient via MyChart video visit to discuss labs She is doing well overall.  Completed her Eliquis on Friday.  Reports that for the past 2 weeks difficult to describe it but may be having some discomfort with the breathing had 2 episodes in the past couple of weeks.  Does not really think it is a sharp pain.  No shortness of breath.  The sensation is nowhere close to when she was diagnosed with PE.  REVIEW OF SYSTEMS:   ROS  As per HPI. Otherwise, a complete review of systems is negative.  PAST MEDICAL HISTORY: Past Medical History:  Diagnosis Date   Anxiety    Clotting disorder (HCC)    Depression    Medical history non-contributory     PAST SURGICAL HISTORY: Past Surgical History:  Procedure Laterality Date   CESAREAN SECTION N/A 04/19/2020   Procedure: CESAREAN SECTION;  Surgeon: Vena Austria, MD;  Location: [facility] ORS;  Service: Obstetrics;  Laterality: N/A;   CHOLECYSTECTOMY     NO PAST SURGERIES      FAMILY HISTORY: Family History  Problem Relation Buckley of Onset   Healthy Mother    Healthy Father    Clotting disorder Maternal Aunt    Clotting disorder Other     HEALTH MAINTENANCE: Social History   Tobacco Use   Smoking status: Never   Smokeless tobacco: Never  Vaping Use   Vaping status: Never Used  Substance Use Topics   Alcohol use:  Not Currently   Drug use: Never     No Known Allergies  Current Outpatient Medications  Medication Sig Dispense Refill   apixaban (ELIQUIS) 5 MG TABS tablet Take 2 tablets (10 mg) PO BID x 7 days, then reduce dose to 1 tablet (5 mg) PO BID. 60 tablet 2   norethindrone (MICRONOR) 0.35 MG tablet Take 1 tablet by mouth daily.     No current facility-administered medications for this visit.    OBJECTIVE: There were no vitals filed for this visit.    There is no height or weight on file to calculate BMI.       Physical exam not performed.  Virtual visit.  LAB RESULTS:  Lab Results  Component Value Date   NA 136 06/20/2023   K 4.4 06/20/2023   CL 105 06/20/2023   CO2 24 06/20/2023   GLUCOSE 88 06/20/2023   BUN 11 06/20/2023   CREATININE 0.87 06/20/2023   CALCIUM 8.8 (L) 06/20/2023   PROT 7.9 06/20/2023   ALBUMIN 3.6 06/20/2023   AST 18 06/20/2023   ALT 20 06/20/2023   ALKPHOS 73 06/20/2023   BILITOT 0.5 06/20/2023   GFRNONAA >60 06/20/2023   GFRAA >60 04/19/2020    Lab Results  Component Value Date   WBC 4.5 06/20/2023   NEUTROABS 2.3 06/20/2023   HGB 13.3 06/20/2023   HCT 41.6 06/20/2023   MCV 80.2 06/20/2023   PLT 394 06/20/2023    No results found for: "TIBC", "FERRITIN", "IRONPCTSAT"   STUDIES: No results found.  ASSESSMENT AND PLAN:   [name] is a 22 y.o. female with no significant past medical history was referred to hematology for management of acute right segmental PE.  # Acute right segmental/subsegmental PE -Diagnosed 03/26/2023.  Likely provoked by estrogen containing OCP.  Patient has stopped taking it since the diagnosis.  She is on norethindrone only contraception. -Completed 3 months of Eliquis in November 2024.  Repeat D-dimer level has decreased from 1.8-0.2. -Hypercoag panel negative -HERDOO2 score is 1 which is low risk.  Will go ahead and discontinue anticoagulation.  She reports mild discomfort in her chest with breathing had 2 episodes in the past couple of weeks.  Not really sharp pain which she had at the time of clot diagnosis.  We decided to monitor the symptoms and if it worsens to have repeat CTA chest. -She will benefit with prophylactic Lovenox injection antepartum and 6 weeks postpartum.  Avoid estrogen containing contraception.  RTC as needed  Patient expressed understanding and was in agreement with this plan. She also understands that She can call clinic at any time with any questions, concerns, or complaints.   I spent a total  of 30 minutes reviewing chart data, face-to-face evaluation with the patient, counseling and coordination of care as detailed above.  Michaelyn Barter, MD   06/27/2023 3:34 PM

## 2023-11-13 ENCOUNTER — Ambulatory Visit (INDEPENDENT_AMBULATORY_CARE_PROVIDER_SITE_OTHER): Payer: 59 | Admitting: Family Medicine

## 2023-11-13 ENCOUNTER — Encounter: Payer: Self-pay | Admitting: Family Medicine

## 2023-11-13 VITALS — BP 126/72 | HR 64 | Temp 98.2°F | Ht 62.0 in | Wt 240.0 lb

## 2023-11-13 DIAGNOSIS — S46812A Strain of other muscles, fascia and tendons at shoulder and upper arm level, left arm, initial encounter: Secondary | ICD-10-CM

## 2023-11-13 DIAGNOSIS — M542 Cervicalgia: Secondary | ICD-10-CM

## 2023-11-13 DIAGNOSIS — Z86711 Personal history of pulmonary embolism: Secondary | ICD-10-CM

## 2023-11-13 DIAGNOSIS — J302 Other seasonal allergic rhinitis: Secondary | ICD-10-CM | POA: Diagnosis not present

## 2023-11-13 DIAGNOSIS — Z7689 Persons encountering health services in other specified circumstances: Secondary | ICD-10-CM

## 2023-11-13 NOTE — Progress Notes (Signed)
 New Patient Office Visit   Subjective  Patient ID: Donna Buckley, female    DOB: 2000/09/13  Age: 23 y.o. MRN: [id number]  Chief Complaint  Patient presents with   New Patient (Initial Visit)    Patient is a 23 year old female seen for establish care and acute concern.  Patient states she has not had a PCP in years.  History of PE: Was on Eliquis x 3 months for history of clot in 2024.  Thought 2/2 estrogen and OCPs.  Patient denies tobacco use, prolonged travel.  Notes her maternal aunt is on chronic anticoagulation for history of recurrent blood clots.  Contraception management: Currently on progesterone only OCPs.  States taking consistently each day.  Followed by OB/GYN.  Neck pain: Patient endorses left-sided neck pain x 3 days.  States symptoms started randomly on Saturday after out running errands.  Has a throbbing, stiff sensation to posterior left neck into left mid shoulder.  Feels slightly worse.  Hurts to turn head.  Tried ibuprofen 800 mg, lidocaine patch, heat, massager, Aleve.  Seasonal allergies: Kirke Shaggy as needed  Allergies: NKDA, NKA  Past surgical history: Cholecystectomy November 2021 C-section September 2021  Social history: Patient currently works as a Associate Professor for [employer].  Patient has a son.  Denies tobacco, drug, alcohol use.  Family medical history: Mom-woke up 1 morning unable to walk.  Currently walking with a walker/cane but diagnoses still unknown.  Being worked up for autoimmune issue. MGM-ESRD on HD, DM, HTN      ROS Negative unless stated above    Objective:     BP 126/72 (BP Location: Left Arm, Patient Position: Sitting, Cuff Size: Normal)   Pulse 64   Temp 98.2 F (36.8 C) (Oral)   Ht 5\' 2"  (1.575 m)   Wt 240 lb (108.9 kg)   LMP 10/31/2023 (Exact Date)   SpO2 98%   BMI 43.90 kg/m    Physical Exam Constitutional:      General: She is not in acute distress.    Appearance: Normal appearance.  HENT:     Head:  Normocephalic and atraumatic.     Nose: Nose normal.     Mouth/Throat:     Mouth: Mucous membranes are moist.  Cardiovascular:     Rate and Rhythm: Normal rate and regular rhythm.     Heart sounds: Normal heart sounds. No murmur heard.    No gallop.  Pulmonary:     Effort: Pulmonary effort is normal. No respiratory distress.     Breath sounds: Normal breath sounds. No wheezing, rhonchi or rales.  Musculoskeletal:       Arms:     Comments: TTP of left cervical portion of trapezius muscle to left mid shoulder.  Increased tension in trapezius noted.  Skin:    General: Skin is warm and dry.  Neurological:     Mental Status: She is alert and oriented to person, place, and time.      No results found for any visits on 11/13/23.    Assessment & Plan:  Neck pain  Strain of left trapezius muscle, initial encounter  Seasonal allergies  History of pulmonary embolus (PE)  Encounter to establish care -We reviewed the PMH, PSH, FH, SH, Meds and Allergies. -We provided refills for any medications we will prescribe as needed. -We addressed current concerns per orders and patient instructions. -We have asked for records for pertinent exams, studies, vaccines and notes from previous providers. -We have advised patient to follow  up per instructions below.  Patient with acute neck pain likely 2/2 trapezius muscle strain.  Continue supportive care with OTC ibuprofen, heat, massage, topical analgesics, stretching, etc.  Given handout.  Continue OTC Allegra as needed for seasonal allergies.  History of PE status post Eliquis.  For recurrence indefinite anticoagulation.  Return if symptoms worsen or fail to improve.  Follow-up at your convenience for CPE and labs.  Deeann Saint, MD

## 2023-11-13 NOTE — Patient Instructions (Signed)
 You can schedule a physical/blood work at [facility].  Continue ibuprofen, heat, stretching, massage, etc. for your neck pain symptoms.

## 2023-12-22 ENCOUNTER — Encounter: Payer: Self-pay | Admitting: Family Medicine

## 2023-12-22 ENCOUNTER — Ambulatory Visit (INDEPENDENT_AMBULATORY_CARE_PROVIDER_SITE_OTHER): Admitting: Family Medicine

## 2023-12-22 VITALS — BP 120/74 | HR 90 | Temp 98.5°F | Ht 62.0 in | Wt 241.6 lb

## 2023-12-22 DIAGNOSIS — Z1159 Encounter for screening for other viral diseases: Secondary | ICD-10-CM | POA: Diagnosis not present

## 2023-12-22 DIAGNOSIS — Z0000 Encounter for general adult medical examination without abnormal findings: Secondary | ICD-10-CM

## 2023-12-22 LAB — CBC WITH DIFFERENTIAL/PLATELET
Basophils Absolute: 0 10*3/uL (ref 0.0–0.1)
Basophils Relative: 0.5 % (ref 0.0–3.0)
Eosinophils Absolute: 0.1 10*3/uL (ref 0.0–0.7)
Eosinophils Relative: 1.6 % (ref 0.0–5.0)
HCT: 43.7 % (ref 36.0–46.0)
Hemoglobin: 14.1 g/dL (ref 12.0–15.0)
Lymphocytes Relative: 34.7 % (ref 12.0–46.0)
Lymphs Abs: 2.3 10*3/uL (ref 0.7–4.0)
MCHC: 32.3 g/dL (ref 30.0–36.0)
MCV: 81.4 fl (ref 78.0–100.0)
Monocytes Absolute: 0.6 10*3/uL (ref 0.1–1.0)
Monocytes Relative: 9 % (ref 3.0–12.0)
Neutro Abs: 3.6 10*3/uL (ref 1.4–7.7)
Neutrophils Relative %: 54.2 % (ref 43.0–77.0)
Platelets: 356 10*3/uL (ref 150.0–400.0)
RBC: 5.37 Mil/uL — ABNORMAL HIGH (ref 3.87–5.11)
RDW: 14 % (ref 11.5–15.5)
WBC: 6.6 10*3/uL (ref 4.0–10.5)

## 2023-12-22 LAB — COMPREHENSIVE METABOLIC PANEL WITH GFR
ALT: 15 U/L (ref 0–35)
AST: 13 U/L (ref 0–37)
Albumin: 3.9 g/dL (ref 3.5–5.2)
Alkaline Phosphatase: 72 U/L (ref 39–117)
BUN: 6 mg/dL (ref 6–23)
CO2: 26 meq/L (ref 19–32)
Calcium: 8.8 mg/dL (ref 8.4–10.5)
Chloride: 103 meq/L (ref 96–112)
Creatinine, Ser: 0.78 mg/dL (ref 0.40–1.20)
GFR: 106.53 mL/min (ref >60.00)
Glucose, Bld: 84 mg/dL (ref 70–99)
Potassium: 4.4 meq/L (ref 3.5–5.1)
Sodium: 136 meq/L (ref 135–145)
Total Bilirubin: 0.5 mg/dL (ref 0.2–1.2)
Total Protein: 6.9 g/dL (ref 6.0–8.3)

## 2023-12-22 LAB — LIPID PANEL
Cholesterol: 132 mg/dL (ref 0–200)
HDL: 44.4 mg/dL (ref >39.00)
LDL Cholesterol: 77 mg/dL (ref 0–99)
NonHDL: 87.73
Total CHOL/HDL Ratio: 3
Triglycerides: 52 mg/dL (ref 0.0–149.0)
VLDL: 10.4 mg/dL (ref 0.0–40.0)

## 2023-12-22 LAB — TSH: TSH: 0.89 u[IU]/mL (ref 0.35–5.50)

## 2023-12-22 LAB — HEMOGLOBIN A1C: Hgb A1c MFr Bld: 6 % (ref 4.6–6.5)

## 2023-12-22 LAB — T4, FREE: Free T4: 1.14 ng/dL (ref 0.60–1.60)

## 2023-12-22 NOTE — Progress Notes (Signed)
 Established Patient Office Visit   Subjective  Patient ID: Donna Buckley, female    DOB: 12/01/2000  Age: 23 y.o. MRN: [id number]  Chief Complaint  Patient presents with   Annual Exam    Patient is a 23 year old female seen for CPE.  Patient states she is doing well overall.  Patient has history of abnormal Pap around 2022.  S/p colposcopy with normal Pap in 2023.  Patient states she had HPV vaccine dose 1 around that time and had the second dose a while later at [facility].  Patient working on increasing water intake and decreasing soda intake.  Switch from regular Coca-Cola's to diet Coke, drinking 5-6/day.  Patient eating out often as she does not cook.  Limited in time so finds it difficult to shop at grocery store/plan meals.  Patient wants to get on top of her health and is working on making changes.    Patient Active Problem List   Diagnosis Date Noted   Acute pulmonary embolism (HCC) 03/28/2023   Symptomatic cholelithiasis    S/P cesarean section 04/22/2020   Cholestasis during pregnancy in third trimester 04/19/2020   Back pain complicating pregnancy 04/12/2020   Abdominal pain in pregnancy, third trimester 03/06/2020   Encounter for supervision of normal first pregnancy in third trimester 10/23/2019   Past Medical History:  Diagnosis Date   Anxiety    Clotting disorder (HCC)    Depression    Medical history non-contributory    Past Surgical History:  Procedure Laterality Date   CESAREAN SECTION N/A 04/19/2020   Procedure: CESAREAN SECTION;  Surgeon: Darl Edu, MD;  Location: [facility] ORS;  Service: Obstetrics;  Laterality: N/A;   CHOLECYSTECTOMY     NO PAST SURGERIES     Social History   Tobacco Use   Smoking status: Never   Smokeless tobacco: Never  Vaping Use   Vaping status: Never Used  Substance Use Topics   Alcohol use: Not Currently   Drug use: Never   Family History  Problem Relation Age of Onset   Healthy Mother    Healthy Father     Clotting disorder Maternal Aunt    Clotting disorder Other    Diabetes Maternal Grandmother    Hypertension Maternal Grandmother    No Known Allergies    ROS Negative unless stated above    Objective:      BP 120/74 (BP Location: Left Arm, Patient Position: Sitting, Cuff Size: Large)   Pulse 90   Temp 98.5 F (36.9 C) (Oral)   Ht 5\' 2"  (1.575 m)   Wt 241 lb 9.6 oz (109.6 kg)   LMP 11/27/2023 (Exact Date)   SpO2 97%   BMI 44.19 kg/m  BP Readings from Last 3 Encounters:  12/22/23 120/74  11/13/23 126/72  03/28/23 123/89   Wt Readings from Last 3 Encounters:  12/22/23 241 lb 9.6 oz (109.6 kg)  11/13/23 240 lb (108.9 kg)  03/28/23 210 lb 1.6 oz (95.3 kg)      Physical Exam Constitutional:      Appearance: Normal appearance.  HENT:     Head: Normocephalic and atraumatic.     Right Ear: Tympanic membrane, ear canal and external ear normal.     Left Ear: Tympanic membrane, ear canal and external ear normal.     Nose: Nose normal.     Mouth/Throat:     Mouth: Mucous membranes are moist.     Pharynx: No oropharyngeal exudate or posterior oropharyngeal erythema.  Eyes:  General: No scleral icterus.    Extraocular Movements: Extraocular movements intact.     Conjunctiva/sclera: Conjunctivae normal.     Pupils: Pupils are equal, round, and reactive to light.  Neck:     Thyroid: No thyromegaly.  Cardiovascular:     Rate and Rhythm: Normal rate and regular rhythm.     Pulses: Normal pulses.     Heart sounds: Normal heart sounds. No murmur heard.    No friction rub.  Pulmonary:     Effort: Pulmonary effort is normal.     Breath sounds: Normal breath sounds. No wheezing, rhonchi or rales.  Abdominal:     General: Bowel sounds are normal.     Palpations: Abdomen is soft.     Tenderness: There is no abdominal tenderness.  Musculoskeletal:        General: No deformity. Normal range of motion.  Lymphadenopathy:     Cervical: No cervical adenopathy.  Skin:     General: Skin is warm and dry.     Findings: No lesion.  Neurological:     General: No focal deficit present.     Mental Status: She is alert and oriented to person, place, and time.  Psychiatric:        Mood and Affect: Mood normal.        Thought Content: Thought content normal.        11/13/2023    9:33 AM 03/28/2023   11:25 AM  Depression screen PHQ 2/9  Decreased Interest 0 1  Down, Depressed, Hopeless 0 1  PHQ - 2 Score 0 2  Altered sleeping 1 1  Tired, decreased energy 1 1  Change in appetite 1 1  Feeling bad or failure about yourself  0 0  Trouble concentrating 0   Moving slowly or fidgety/restless 0   Suicidal thoughts 0   PHQ-9 Score 3 5  Difficult doing work/chores Not difficult at all       11/13/2023    9:33 AM  GAD 7 : Generalized Anxiety Score  Nervous, Anxious, on Edge 2  Control/stop worrying 1  Worry too much - different things 1  Trouble relaxing 0  Restless 0  Easily annoyed or irritable 0  Afraid - awful might happen 0  Total GAD 7 Score 4  Anxiety Difficulty Somewhat difficult     No results found for any visits on 12/22/23.    Assessment & Plan:  Well adult exam -     CBC with Differential/Platelet; Future -     Comprehensive metabolic panel with GFR; Future -     Hemoglobin A1c; Future -     Lipid panel; Future -     T4, free; Future -     TSH; Future  Need for hepatitis C screening test -     Hepatitis C antibody  Age appropriate health screenings discussed.  Obtain labs.  Pap done 2023 and normal.  Immunizations reviewed.  Patient [facility].  Consider third dose.  Discussed lifestyle modifications including cutting down on soda intake and increasing physical activity.  Neck CPE in 1 year.  Follow-up sooner as needed.  No follow-ups on file.   Viola Greulich, MD

## 2023-12-23 LAB — HEPATITIS C ANTIBODY: Hepatitis C Ab: NONREACTIVE

## 2023-12-25 ENCOUNTER — Encounter: Payer: Self-pay | Admitting: Family Medicine

## 2024-03-14 ENCOUNTER — Other Ambulatory Visit: Payer: Self-pay | Admitting: Family Medicine

## 2024-07-26 ENCOUNTER — Telehealth: Admitting: Family Medicine

## 2024-07-26 ENCOUNTER — Encounter: Payer: Self-pay | Admitting: Family Medicine

## 2024-07-26 DIAGNOSIS — R41840 Attention and concentration deficit: Secondary | ICD-10-CM | POA: Diagnosis not present

## 2024-07-26 DIAGNOSIS — F32A Depression, unspecified: Secondary | ICD-10-CM

## 2024-07-26 NOTE — Progress Notes (Signed)
 Virtual Visit via Video Note  I connected with Donna Buckley on 07/26/2024 at 10:00 AM EST by a video enabled telemedicine application and verified that I am speaking with the correct person using two identifiers.  Location patient: home Location provider:work or home office Persons participating in the virtual visit: patient, provider  I discussed the limitations of evaluation and management by telemedicine and the availability of in person appointments. The patient expressed understanding and agreed to proceed. Chief Complaint  Patient presents with   Acute Visit    Patient has concerns about her mental state.  Patient thinks she has ADHD.     HPI: Pt is a 23 yo female seen for acute concern.  Pt concerned about having ADHD.  Endorses impulsiveness especially with spending and decisions.  States has a hyperfixation on getting something and can't stop focusing on it until she gets it.  Has overwhelming thoughts that are crippling.  States can have a plan to do something but it may take hours to get started.  Makes pt tearful.  Pt have difficulty falling asleep.  Feels like was distracted as a kid.   Would doodle on paper in class.  Grades were good in Elementary, Middle, and HS.  Had to actually study/do more to get Bs and Cs in college.  Pt states she tried college 3 x but couldn't figure out how to get   ROS: See pertinent positives and negatives per HPI.  Past Medical History:  Diagnosis Date   Anxiety    Clotting disorder    Depression    Medical history non-contributory     Past Surgical History:  Procedure Laterality Date   CESAREAN SECTION N/A 04/19/2020   Procedure: CESAREAN SECTION;  Surgeon: Lake Read, MD;  Location: [facility] ORS;  Service: Obstetrics;  Laterality: N/A;   CHOLECYSTECTOMY     NO PAST SURGERIES      Family History  Problem Relation Age of Onset   Healthy Mother    Healthy Father    Clotting disorder Maternal Aunt    Clotting disorder Other     Diabetes Maternal Grandmother    Hypertension Maternal Grandmother    Current Medications[1]  EXAM:  VITALS per patient if applicable:  RR between 12-20 bpm.    GENERAL: alert, oriented, appears well and in no acute distress.  Pt tearful during visit.  HEENT: atraumatic, conjunctiva clear, no obvious abnormalities on inspection of external nose and ears  NECK: normal movements of the head and neck  LUNGS: on inspection no signs of respiratory distress, breathing rate appears normal, no obvious gross SOB, gasping or wheezing  CV: no obvious cyanosis  MS: moves all visible extremities without noticeable abnormality  PSYCH/NEURO: pleasant and cooperative, no obvious depression or anxiety, speech and thought processing grossly intact     07/26/2024    9:52 AM 12/22/2023    9:28 AM 11/13/2023    9:33 AM  Depression screen PHQ 2/9  Decreased Interest 1 0 0  Down, Depressed, Hopeless 2 0 0  PHQ - 2 Score 3 0 0  Altered sleeping 2 0 1  Tired, decreased energy 2 0 1  Change in appetite 2 0 1  Feeling bad or failure about yourself  1 0 0  Trouble concentrating 2 0 0  Moving slowly or fidgety/restless 0 0 0  Suicidal thoughts 0 0 0  PHQ-9 Score 12 0  3   Difficult doing work/chores   Not difficult at all  Data saved with a previous flowsheet row definition      07/26/2024    9:54 AM 12/22/2023    9:29 AM 11/13/2023    9:33 AM  GAD 7 : Generalized Anxiety Score  Nervous, Anxious, on Edge 2 0 2  Control/stop worrying 2 0 1  Worry too much - different things 1 0 1  Trouble relaxing 2 0 0  Restless 1 0 0  Easily annoyed or irritable 3 0 0  Afraid - awful might happen 0 0 0  Total GAD 7 Score 11 0 4  Anxiety Difficulty   Somewhat difficult     ASSESSMENT AND PLAN:  Discussed the following assessment and plan:  Poor concentration  Mild depression  Concerns regarding possible ADHD.  Formal ADHD testing advised.  Given names of area providers that offer this service.   Pt to schedule an appt.  Further rec based on results.  Discussed possible overlap with anxiety and depression sx.  PHQ 9 score 12 and GAD 7 score 11 this visit.  Pt denies depression.  Counseling encouraged.   F/u in 2-3 months after testing completed.   I discussed the assessment and treatment plan with the patient. The patient was provided an opportunity to ask questions and all were answered. The patient agreed with the plan and demonstrated an understanding of the instructions.   The patient was advised to call back or seek an in-person evaluation if the symptoms worsen or if the condition fails to improve as anticipated.   [name] Single, MD      [1]  Current Outpatient Medications:    Elderberry-Vitamin C-Zinc (ELDERBERRY EXTRACT PO), Take by mouth., Disp: , Rfl:    fexofenadine (ALLEGRA) 180 MG tablet, Take 180 mg by mouth daily., Disp: , Rfl:    norethindrone (MICRONOR) 0.35 MG tablet, TAKE 1 TABLET ORALLY ONCE DAILY AT THE SAME TIME EACH DAY, Disp: 84 tablet, Rfl: 4

## 2024-07-26 NOTE — Patient Instructions (Signed)
 Behavioral Health Services: to make an appointment contact the office/provider you are interested in seeing.  No referral is needed.  The below is not an all inclusive list, but will help you get started.  [email or web address] -counseling located off of [address]  [email or web address] -website helps you find providers in your area  [facility] -Located off of [address]. across from [place]  Dr. Sable is a Therapist, Sports with [facility]. [phone]  [facility]  [facility]  -[address]  [phone] -a place in town that has counseling and Psychiatry services.    [facility] [address] [place], [place] [phone]  [facility] [address], [address] [place], [place] [phone]  [facility] [address], [place] [address] [phone]  Dr. Rodgers Macintosh [facility] [address] [place], [place] [address] [phone]  Dr. Elna Lo [facility] [address] [place] [place], [place] [address] [phone]  Look into formal adult ADHD testing at a Psychology office in the area.  [facility] is one of them.  No referral is needed.
